# Patient Record
Sex: Male | Born: 1960 | Race: White | Hispanic: No | Marital: Married | Smoking: Never smoker
Health system: Southern US, Community
[De-identification: ages and names within clinical notes are randomized; demographics above are authoritative.]

## PROBLEM LIST (undated history)

## (undated) DIAGNOSIS — Z87442 Personal history of urinary calculi: Secondary | ICD-10-CM

## (undated) DIAGNOSIS — E039 Hypothyroidism, unspecified: Secondary | ICD-10-CM

## (undated) DIAGNOSIS — C801 Malignant (primary) neoplasm, unspecified: Secondary | ICD-10-CM

## (undated) HISTORY — PX: CYSTOSCOPY: SUR368

## (undated) HISTORY — PX: EXTRACORPOREAL SHOCK WAVE LITHOTRIPSY: SHX1557

## (undated) HISTORY — PX: COLON SURGERY: SHX602

## (undated) HISTORY — PX: HERNIA REPAIR: SHX51

---

## 1999-01-13 ENCOUNTER — Inpatient Hospital Stay (HOSPITAL_COMMUNITY): Admission: EM | Admit: 1999-01-13 | Discharge: 1999-01-14 | Payer: Self-pay | Admitting: Internal Medicine

## 1999-01-15 ENCOUNTER — Inpatient Hospital Stay (HOSPITAL_COMMUNITY): Admission: EM | Admit: 1999-01-15 | Discharge: 1999-01-16 | Payer: Self-pay | Admitting: Interventional Cardiology

## 1999-04-06 ENCOUNTER — Ambulatory Visit: Admission: RE | Admit: 1999-04-06 | Discharge: 1999-04-06 | Payer: Self-pay | Admitting: Urology

## 1999-11-04 ENCOUNTER — Ambulatory Visit (HOSPITAL_COMMUNITY): Admission: RE | Admit: 1999-11-04 | Discharge: 1999-11-04 | Payer: Self-pay | Admitting: Urology

## 1999-11-04 ENCOUNTER — Encounter: Payer: Self-pay | Admitting: Urology

## 2000-05-11 ENCOUNTER — Encounter: Payer: Self-pay | Admitting: Urology

## 2000-05-11 ENCOUNTER — Ambulatory Visit (HOSPITAL_COMMUNITY): Admission: RE | Admit: 2000-05-11 | Discharge: 2000-05-11 | Payer: Self-pay | Admitting: Urology

## 2000-10-03 ENCOUNTER — Ambulatory Visit (HOSPITAL_BASED_OUTPATIENT_CLINIC_OR_DEPARTMENT_OTHER): Admission: RE | Admit: 2000-10-03 | Discharge: 2000-10-03 | Payer: Self-pay | Admitting: Orthopedic Surgery

## 2001-06-21 ENCOUNTER — Ambulatory Visit (HOSPITAL_COMMUNITY): Admission: RE | Admit: 2001-06-21 | Discharge: 2001-06-21 | Payer: Self-pay | Admitting: Urology

## 2001-06-21 ENCOUNTER — Encounter: Payer: Self-pay | Admitting: Urology

## 2002-12-09 ENCOUNTER — Ambulatory Visit (HOSPITAL_COMMUNITY): Admission: RE | Admit: 2002-12-09 | Discharge: 2002-12-09 | Payer: Self-pay | Admitting: Urology

## 2002-12-09 ENCOUNTER — Encounter: Payer: Self-pay | Admitting: Urology

## 2002-12-12 ENCOUNTER — Ambulatory Visit (HOSPITAL_BASED_OUTPATIENT_CLINIC_OR_DEPARTMENT_OTHER): Admission: RE | Admit: 2002-12-12 | Discharge: 2002-12-12 | Payer: Self-pay | Admitting: Urology

## 2002-12-12 ENCOUNTER — Encounter: Payer: Self-pay | Admitting: Urology

## 2004-03-01 ENCOUNTER — Ambulatory Visit (HOSPITAL_BASED_OUTPATIENT_CLINIC_OR_DEPARTMENT_OTHER): Admission: RE | Admit: 2004-03-01 | Discharge: 2004-03-01 | Payer: Self-pay | Admitting: Urology

## 2004-07-12 ENCOUNTER — Encounter: Payer: Self-pay | Admitting: Internal Medicine

## 2005-05-25 ENCOUNTER — Ambulatory Visit: Payer: Self-pay | Admitting: Internal Medicine

## 2005-07-07 ENCOUNTER — Ambulatory Visit: Payer: Self-pay | Admitting: Internal Medicine

## 2005-12-23 ENCOUNTER — Ambulatory Visit: Payer: Self-pay | Admitting: Internal Medicine

## 2006-06-22 ENCOUNTER — Ambulatory Visit (HOSPITAL_COMMUNITY): Admission: RE | Admit: 2006-06-22 | Discharge: 2006-06-22 | Payer: Self-pay | Admitting: Urology

## 2006-08-03 ENCOUNTER — Ambulatory Visit: Payer: Self-pay | Admitting: Internal Medicine

## 2006-08-04 ENCOUNTER — Ambulatory Visit: Payer: Self-pay | Admitting: Cardiology

## 2006-08-04 ENCOUNTER — Inpatient Hospital Stay (HOSPITAL_COMMUNITY): Admission: RE | Admit: 2006-08-04 | Discharge: 2006-08-05 | Payer: Self-pay | Admitting: Internal Medicine

## 2006-08-04 ENCOUNTER — Ambulatory Visit: Payer: Self-pay | Admitting: Internal Medicine

## 2006-08-10 ENCOUNTER — Ambulatory Visit (HOSPITAL_COMMUNITY): Admission: RE | Admit: 2006-08-10 | Discharge: 2006-08-10 | Payer: Self-pay | Admitting: Internal Medicine

## 2006-08-15 ENCOUNTER — Ambulatory Visit (HOSPITAL_COMMUNITY): Admission: RE | Admit: 2006-08-15 | Discharge: 2006-08-15 | Payer: Self-pay | Admitting: Internal Medicine

## 2006-08-18 ENCOUNTER — Ambulatory Visit: Payer: Self-pay | Admitting: Internal Medicine

## 2006-10-07 ENCOUNTER — Inpatient Hospital Stay (HOSPITAL_COMMUNITY): Admission: EM | Admit: 2006-10-07 | Discharge: 2006-10-12 | Payer: Self-pay | Admitting: Emergency Medicine

## 2006-10-19 ENCOUNTER — Ambulatory Visit (HOSPITAL_COMMUNITY): Admission: RE | Admit: 2006-10-19 | Discharge: 2006-10-19 | Payer: Self-pay | Admitting: General Surgery

## 2006-11-20 DIAGNOSIS — Z87442 Personal history of urinary calculi: Secondary | ICD-10-CM

## 2006-11-20 DIAGNOSIS — E785 Hyperlipidemia, unspecified: Secondary | ICD-10-CM

## 2006-11-20 DIAGNOSIS — E8881 Metabolic syndrome: Secondary | ICD-10-CM

## 2006-11-20 DIAGNOSIS — H20029 Recurrent acute iridocyclitis, unspecified eye: Secondary | ICD-10-CM

## 2006-11-22 ENCOUNTER — Inpatient Hospital Stay (HOSPITAL_COMMUNITY): Admission: AD | Admit: 2006-11-22 | Discharge: 2006-11-27 | Payer: Self-pay | Admitting: General Surgery

## 2006-11-23 ENCOUNTER — Ambulatory Visit: Payer: Self-pay | Admitting: Infectious Diseases

## 2006-11-28 ENCOUNTER — Ambulatory Visit (HOSPITAL_COMMUNITY): Admission: RE | Admit: 2006-11-28 | Discharge: 2006-11-28 | Payer: Self-pay | Admitting: General Surgery

## 2006-12-29 ENCOUNTER — Inpatient Hospital Stay (HOSPITAL_COMMUNITY): Admission: RE | Admit: 2006-12-29 | Discharge: 2007-01-03 | Payer: Self-pay | Admitting: General Surgery

## 2006-12-29 ENCOUNTER — Encounter (INDEPENDENT_AMBULATORY_CARE_PROVIDER_SITE_OTHER): Payer: Self-pay | Admitting: Specialist

## 2007-02-01 ENCOUNTER — Ambulatory Visit: Payer: Self-pay | Admitting: Internal Medicine

## 2007-02-02 LAB — CONVERTED CEMR LAB
Cholesterol: 208 mg/dL (ref 0–200)
HDL: 31.6 mg/dL — ABNORMAL LOW (ref >39.0)
Hgb A1c MFr Bld: 5.7 % (ref 4.6–6.0)
Total CHOL/HDL Ratio: 6.6
Triglycerides: 253 mg/dL (ref 0–149)
VLDL: 51 mg/dL — ABNORMAL HIGH (ref 0–40)

## 2007-04-12 ENCOUNTER — Ambulatory Visit: Payer: Self-pay | Admitting: Internal Medicine

## 2007-06-26 ENCOUNTER — Encounter: Payer: Self-pay | Admitting: Internal Medicine

## 2007-06-29 ENCOUNTER — Encounter: Payer: Self-pay | Admitting: Internal Medicine

## 2007-07-17 ENCOUNTER — Encounter: Payer: Self-pay | Admitting: Internal Medicine

## 2007-08-07 ENCOUNTER — Encounter: Payer: Self-pay | Admitting: Internal Medicine

## 2007-08-17 ENCOUNTER — Encounter: Payer: Self-pay | Admitting: Internal Medicine

## 2007-08-21 ENCOUNTER — Telehealth (INDEPENDENT_AMBULATORY_CARE_PROVIDER_SITE_OTHER): Payer: Self-pay | Admitting: *Deleted

## 2007-09-18 ENCOUNTER — Telehealth (INDEPENDENT_AMBULATORY_CARE_PROVIDER_SITE_OTHER): Payer: Self-pay | Admitting: *Deleted

## 2007-09-24 ENCOUNTER — Ambulatory Visit: Payer: Self-pay | Admitting: Internal Medicine

## 2007-09-25 ENCOUNTER — Telehealth (INDEPENDENT_AMBULATORY_CARE_PROVIDER_SITE_OTHER): Payer: Self-pay | Admitting: *Deleted

## 2007-10-01 LAB — CONVERTED CEMR LAB
ALT: 21 units/L (ref 0–53)
AST: 21 units/L (ref 0–37)
Direct LDL: 160.7 mg/dL
HDL: 29.6 mg/dL — ABNORMAL LOW (ref >39.0)
Triglycerides: 225 mg/dL (ref 0–149)
VLDL: 45 mg/dL — ABNORMAL HIGH (ref 0–40)

## 2008-01-31 IMAGING — RF DG SMALL BOWEL
8 series · 8 of 8 positions shown · non-contrast
Comparison: none

CLINICAL DATA: SMALL BOWEL SERIES:
The findings are compatible with acute inflammatory changes involving the distal small bowel and mesentery.

[Series 1: run · 1 of 1 slices shown (1 of 8)]
[im 1/1]
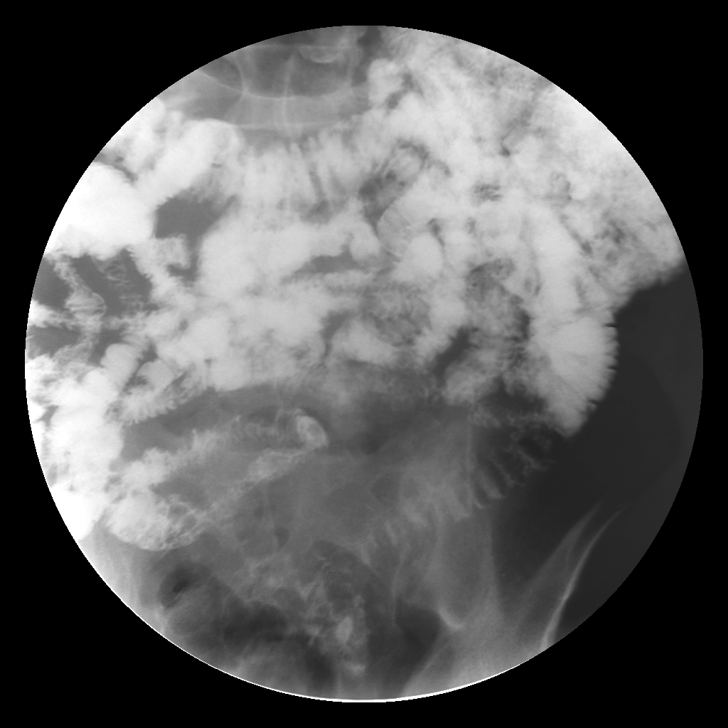

[Series 2: run · 1 of 1 slices shown (2 of 8)]
[im 1/1]
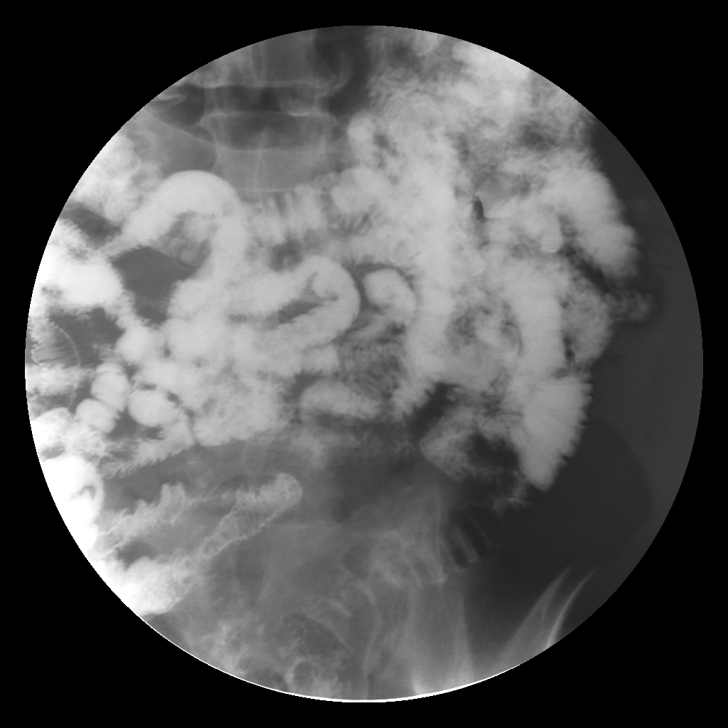

[Series 3: run · 1 of 1 slices shown (3 of 8)]
[im 1/1]
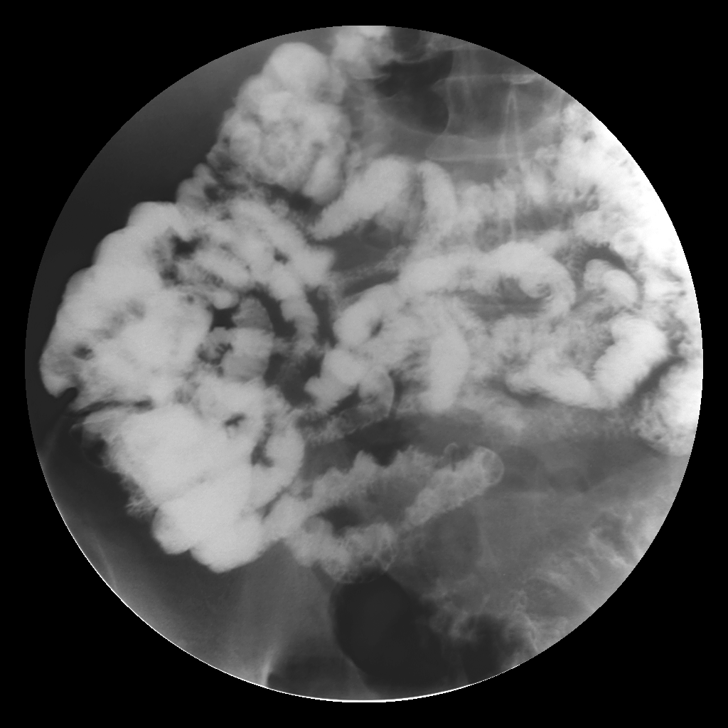

[Series 4: run · 1 of 1 slices shown (4 of 8)]
[im 1/1]
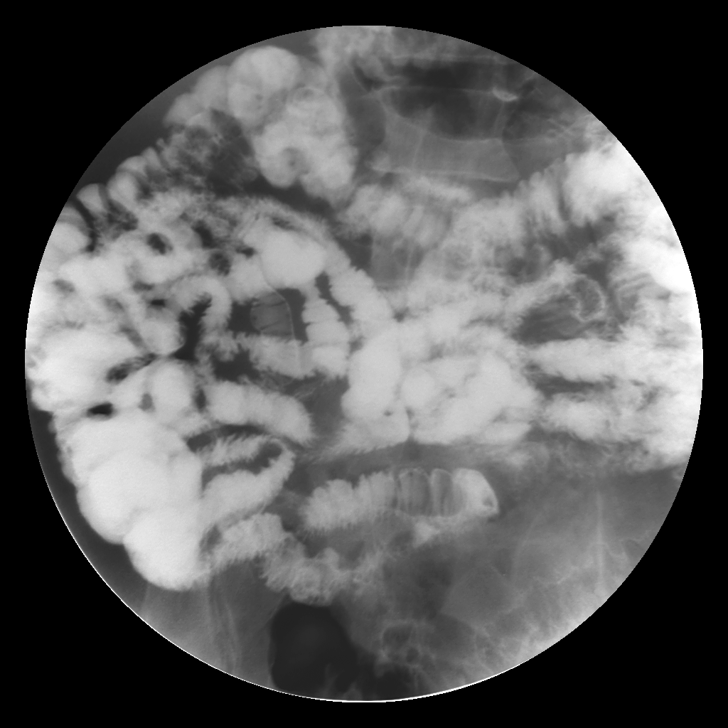

[Series 5: run · 1 of 1 slices shown (5 of 8)]
[im 1/1]
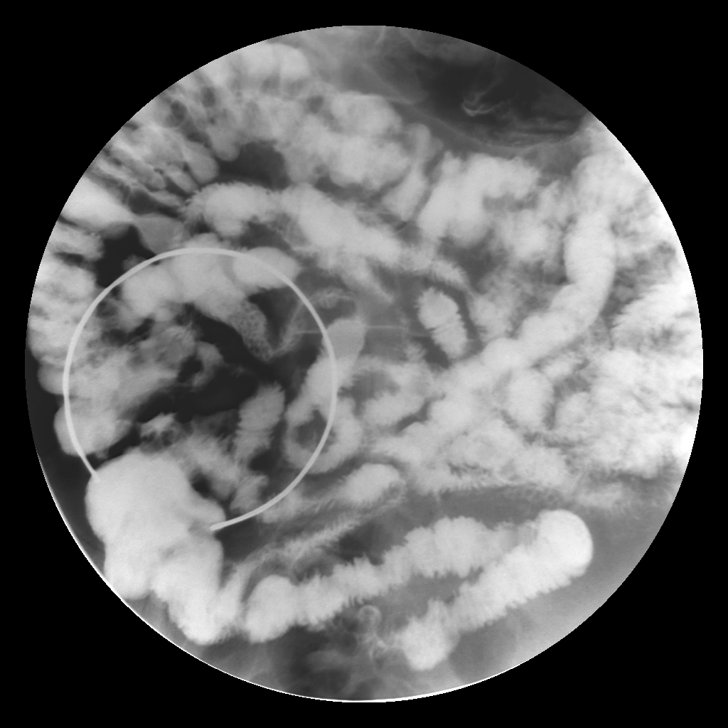

[Series 6: run · 1 of 1 slices shown (6 of 8)]
[im 1/1]
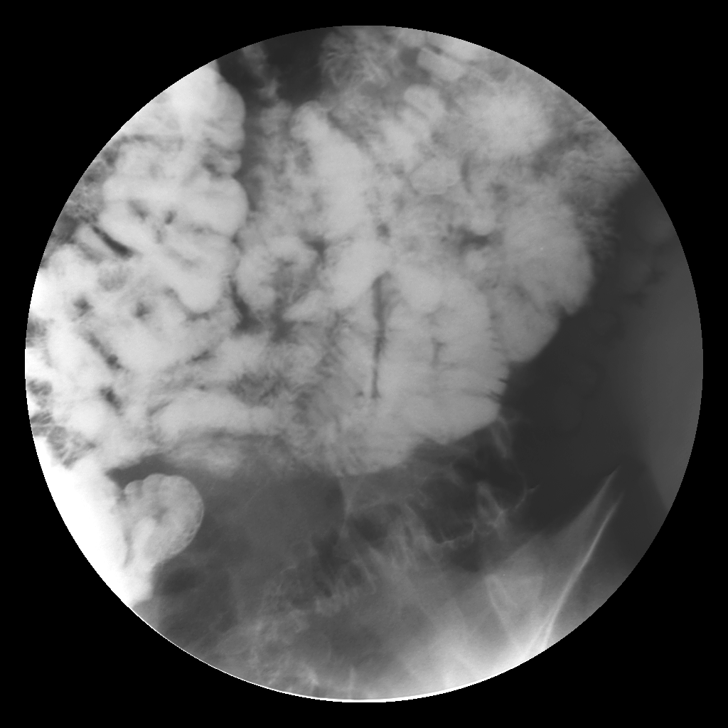

[Series 7: run · 1 of 1 slices shown (7 of 8)]
[im 1/1]
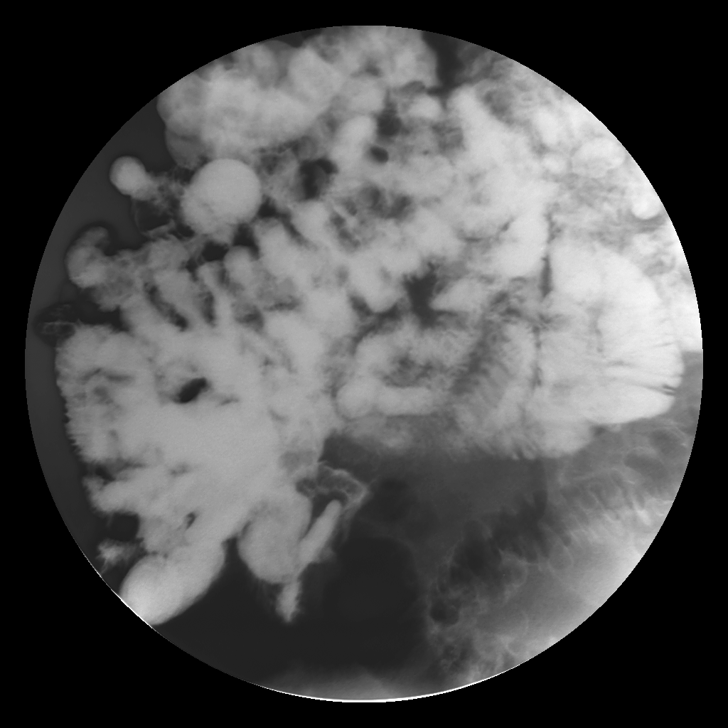

[Series 8: run · 1 of 1 slices shown (8 of 8)]
[im 1/1]
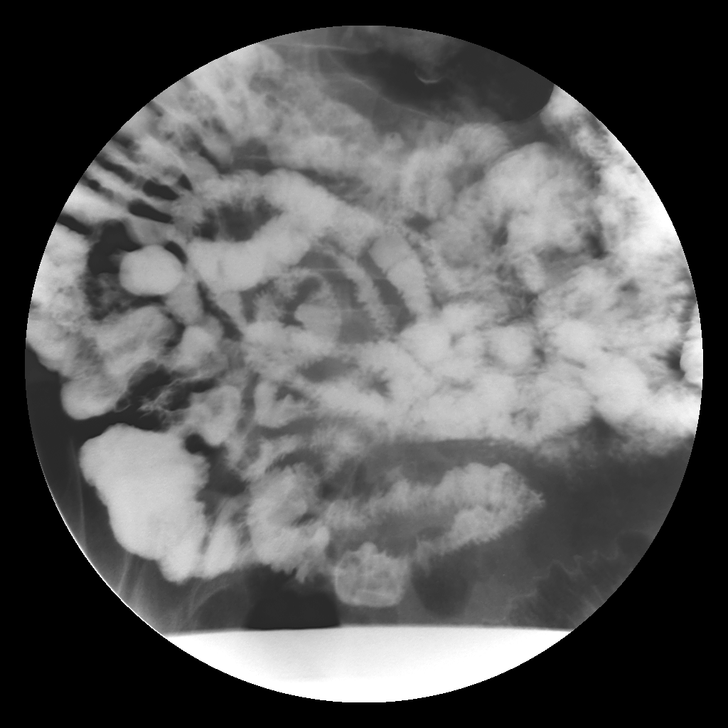

[8 of 8 positions shown; findings below may reference images not displayed]

FINDINGS: The patient ingested a 50/50 mixture Enterovue  and thin liquid barium and serial overhead views plus fluoroscopic guided spot views, with and without manual compression were obtained.  A couple of loops of distal ileum in the pelvis which reveal minimally prominent mucosal fold pattern and the loops are somewhat separated from each other. This is compatible with the inflammatory changes noted on CT.  There is no evidence of small bowel stricture, fistula or sinus tract. There are no findings to strongly suggest the presence of Crohn disease.  The terminal ileum is normal.  No delay in barium transit through the small bowel.
IMPRESSION: Findings compatible with mild and subtle inflammatory changes involving a couple of distal ileal loops.  These may represent reactive changes due to an adjacent inflammatory process as opposed to primary small bowel pathology.

## 2008-02-03 IMAGING — CR DG ABDOMEN 1V
2 series · 2 of 2 positions shown · non-contrast
Comparison: none

CLINICAL DATA: Diverticulitis.  
 ABDOMEN ? 1 VIEW:

[t abdomen supine (1 of 2)]
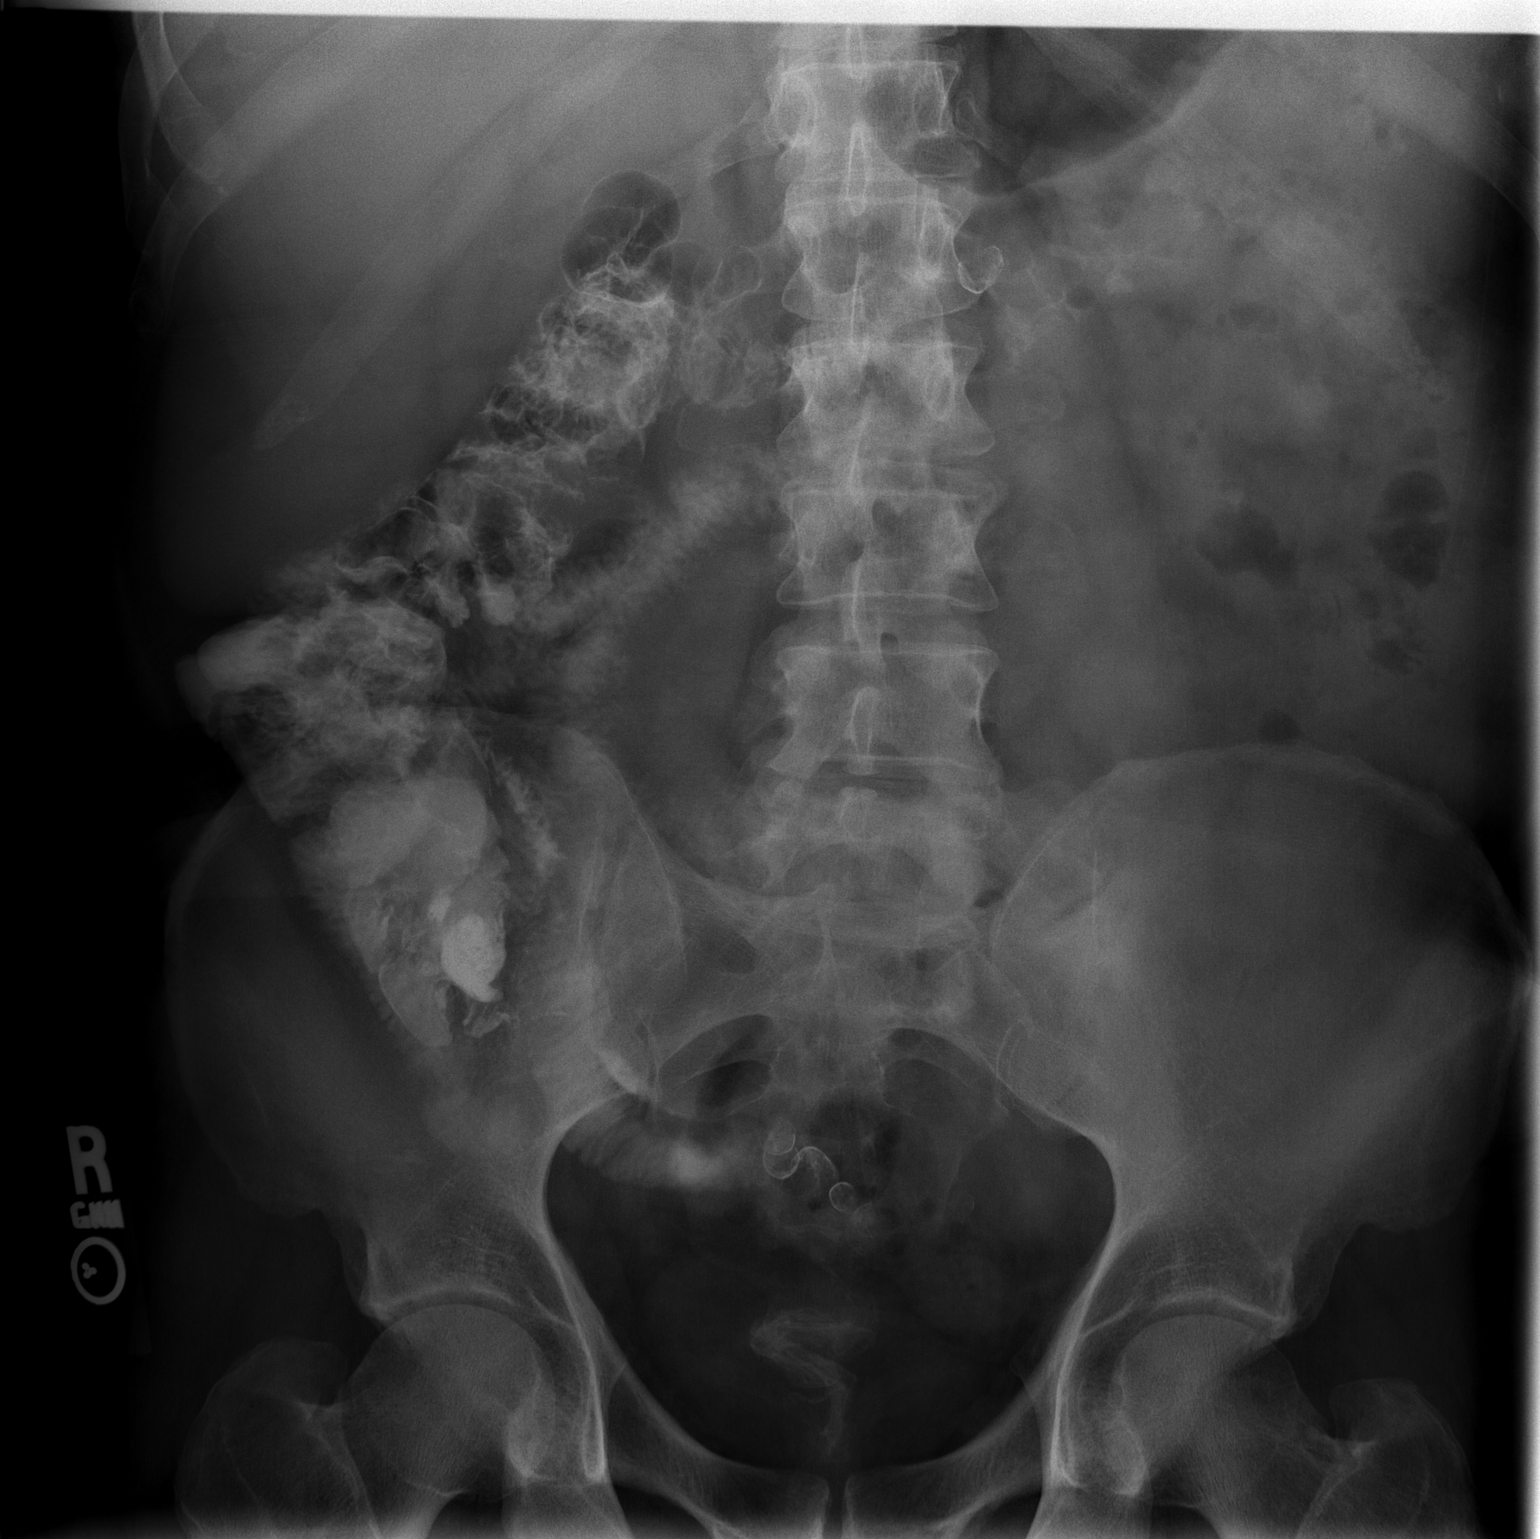

[t abdomen supine (2 of 2)]
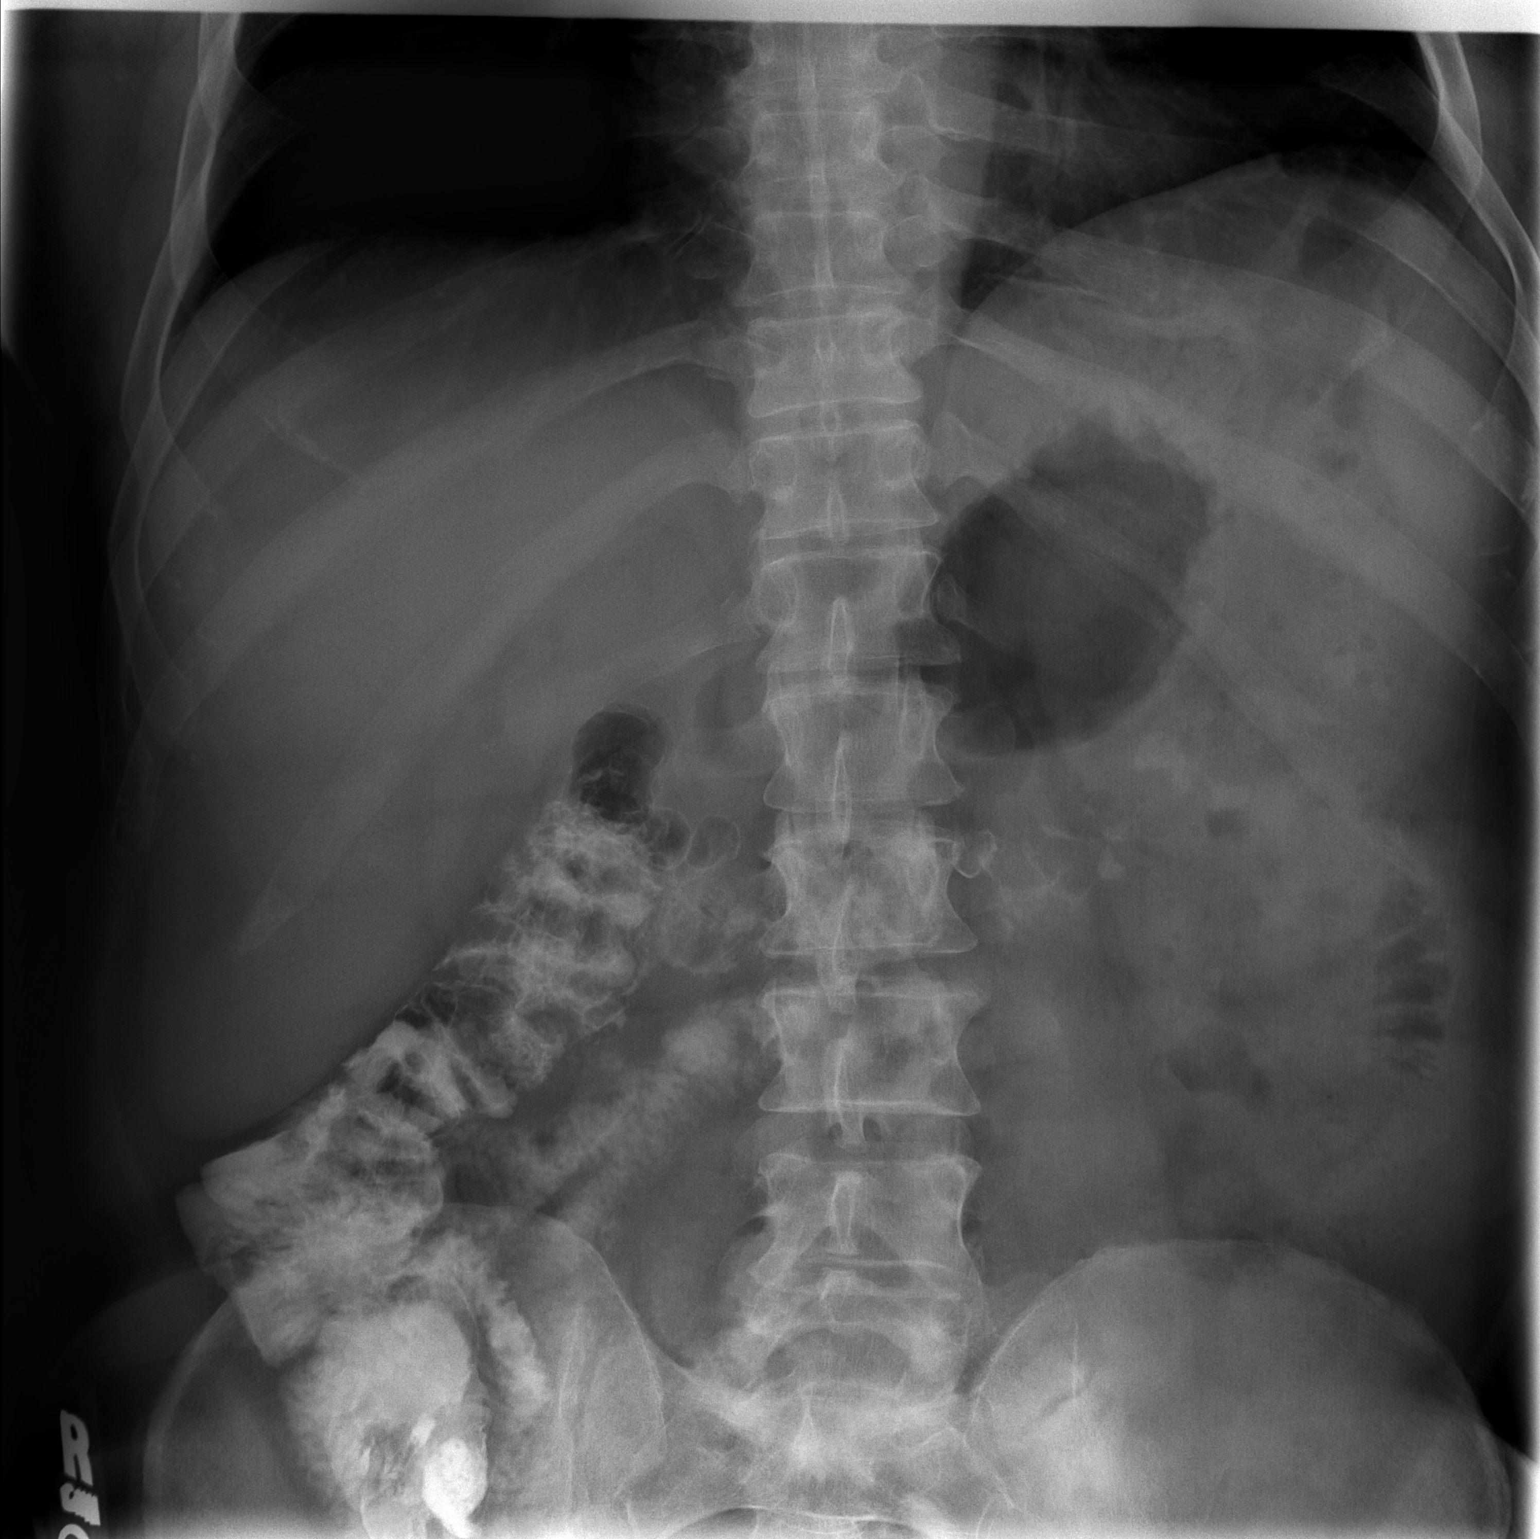

[2 of 2 positions shown; findings below may reference images not displayed]

FINDINGS: There is contrast seen within the distal small bowel loops and within the right colon.  There is a small amount contrast seen within sigmoid colonic diverticula.  The left colon and sigmoid colon are not filled with contrast.  The bowel gas pattern is within normal limits.
IMPRESSION: Normal bowel gas pattern.  Mild retained contrast noted within the right colon and distal small bowel loops.

## 2008-03-05 ENCOUNTER — Telehealth (INDEPENDENT_AMBULATORY_CARE_PROVIDER_SITE_OTHER): Payer: Self-pay | Admitting: *Deleted

## 2008-04-10 ENCOUNTER — Telehealth (INDEPENDENT_AMBULATORY_CARE_PROVIDER_SITE_OTHER): Payer: Self-pay | Admitting: *Deleted

## 2008-04-24 ENCOUNTER — Ambulatory Visit: Payer: Self-pay | Admitting: Internal Medicine

## 2008-04-24 DIAGNOSIS — Z8719 Personal history of other diseases of the digestive system: Secondary | ICD-10-CM

## 2008-04-24 DIAGNOSIS — K5730 Diverticulosis of large intestine without perforation or abscess without bleeding: Secondary | ICD-10-CM | POA: Insufficient documentation

## 2008-04-28 ENCOUNTER — Ambulatory Visit: Payer: Self-pay | Admitting: Internal Medicine

## 2008-05-06 ENCOUNTER — Telehealth (INDEPENDENT_AMBULATORY_CARE_PROVIDER_SITE_OTHER): Payer: Self-pay | Admitting: *Deleted

## 2008-05-07 ENCOUNTER — Telehealth: Payer: Self-pay | Admitting: Internal Medicine

## 2008-05-09 ENCOUNTER — Telehealth (INDEPENDENT_AMBULATORY_CARE_PROVIDER_SITE_OTHER): Payer: Self-pay | Admitting: *Deleted

## 2008-09-30 ENCOUNTER — Ambulatory Visit (HOSPITAL_BASED_OUTPATIENT_CLINIC_OR_DEPARTMENT_OTHER): Admission: RE | Admit: 2008-09-30 | Discharge: 2008-09-30 | Payer: Self-pay | Admitting: Urology

## 2010-11-21 LAB — CONVERTED CEMR LAB
ALT: 25 units/L (ref 0–53)
Albumin: 4 g/dL (ref 3.5–5.2)
Alkaline Phosphatase: 41 units/L (ref 39–117)
Basophils Absolute: 0 10*3/uL (ref 0.0–0.1)
Basophils Relative: 1 % (ref 0.0–1.0)
Chloride: 105 meq/L (ref 96–112)
Creatinine, Ser: 1.3 mg/dL (ref 0.4–1.5)
Eosinophils Absolute: 0.1 10*3/uL (ref 0.0–0.7)
Eosinophils Relative: 3.1 % (ref 0.0–5.0)
GFR calc non Af Amer: 63 mL/min
HDL: 26.8 mg/dL — ABNORMAL LOW (ref >39.0)
Hgb A1c MFr Bld: 6.2 % — ABNORMAL HIGH (ref 4.6–6.0)
Lymphocytes Relative: 22.2 % (ref 12.0–46.0)
MCV: 93 fL (ref 78.0–100.0)
Monocytes Relative: 12.1 % — ABNORMAL HIGH (ref 3.0–12.0)
Neutro Abs: 2.1 10*3/uL (ref 1.4–7.7)
Platelets: 199 10*3/uL (ref 150–400)
Potassium: 4.6 meq/L (ref 3.5–5.1)
Sodium: 142 meq/L (ref 135–145)
TSH: 3.38 microintl units/mL (ref 0.35–5.50)
Total CHOL/HDL Ratio: 8.7
Total Protein: 7 g/dL (ref 6.0–8.3)
VLDL: 41 mg/dL — ABNORMAL HIGH (ref 0–40)
WBC: 3.3 10*3/uL — ABNORMAL LOW (ref 4.5–10.5)

## 2011-03-08 NOTE — Op Note (Signed)
NAMECLEARNCE, LEJA                 ACCOUNT NO.:  [id number]   MEDICAL RECORD NO.:  [id number]          PATIENT TYPE:  AMB   LOCATION:  [facility]                         FACILITY:  [facility]   PHYSICIAN:  Bertram Millard. Dahlstedt, M.D.DATE OF BIRTH:  [birth year]   DATE OF PROCEDURE:  09/30/2008  DATE OF DISCHARGE:                               OPERATIVE REPORT   SURGEON:  Dr. Marcine Matar.   ASSISTANT:  Dr. Aldean Baker.   PREOPERATIVE DIAGNOSIS:  Bilateral renal stones.   POSTOPERATIVE DIAGNOSIS:  Bilateral renal stones.   PROCEDURES:  1. Pan cystourethroscopy.  2. Bilateral retrograde pyelogram.  3. Right ureteroscopy with laser lithotripsy and basket stone      extraction.  4. Left ureteroscopy with laser lithotripsy and basket stone      extraction.  5. Placement of a left-sided ureteral stent.  6. Bilateral retrograde pyelograms.   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  He has had multiple stone  procedures in the past and has developed recurrent stones with bilateral  flank pain.  He is here today for elective ureteroscopy and laser  lithotripsy with stone manipulation.  Risks and benefits were discussed  with the patient as well as alternatives preoperatively and he elected  to proceed.   PROCEDURE IN DETAIL:  The patient was brought back to the operative  room.  After successful induction of general endotracheal anesthetic, he  was placed in dorsal position and all pressure points padded  appropriately.  A preoperative time-out was performed and he received  preoperative antibiotics.   Using a 22-French sheath with a 12 and 70 degrees lens, pan  cystourethroscopy was performed.  The patient's urethra and prostate  appeared normal.  There was no evidence of stricture, tumor, foreign  body or other anomaly.  On entering the patient's bladder, both ureteral  orifices were seen at the lateral  aspects of the trigone effluxing clear  urine.  There is no anomaly seen in the bladder, specifically, no  evidence of tumor, foreign body or stone.   At this point the right ureteral orifice was cannulated with end-hole  catheter and a retrograde pyelogram shot.  Filling defects were seen in  the interpolar and lower pole areas of the right kidney.  No ureteral  defects were seen.  At this point a sensor wire was placed in the upper  right collecting system.   Over the Glidewire, the access sheath was placed under direct  fluoroscopic visualization.  Once into and near the UPJ,  we used a  digital ureteroscope to examine the patient's upper collecting system.  Stones were seen in the interpolar and lower polar areas.  The stones  were lasered to smaller pieces that would fit through the access sheath.  The laser setting was 0.8 joules and 5 Hz.  The stones were then grasped  with a nitinol basket and brought out through the access sheath.  Once  all the fragments had been removed, the entire collecting system was  reinspected and  no stones were seen.  At this point, the access sheath  was removed under direct visualization.   At this point, the left ureteral orifice was cannulated with 5-French  end-hole catheter.  Retrograde pyelogram showed filling defects in the  lower pole of the left kidney.  No ureteral anomalies were seen.  At  this point a sensor wire was placed up fluoroscopically and an access  sheath was placed over it to the level of UPJ.  Then using the flexible  scope, We navigated the entire collecting system and identified an  approximately 8 mm lower pole stone.  The stone was lasered at settings  of 0.8 joules and 5 Hz into smaller fragments which were grasped with  the basket and removed in their entirety through the access sheath.  No  large stone fragments were seen at the end of the procedure.  However,  due to some minimal bleeding with clots a 6 x 26-French  left ureteral  stent was placed.  Good proximal and distal curls were seen and the  tether was secured to the urethral meatus.  At this point using a 22-  French sheath, the bladder was emptied.  There were no stones seen in  the bladder and the procedure was ended.  Please note Dr. Retta Diones was  responsible surgeon and present throughout the entirety of the case.  Estimated blood loss was minimal.  Urine output was not recorded.   DRAINS:  A 6 x 26 French left ureteral stent.   SPECIMENS:  Stone for analysis.   DISPOSITION:  The patient will go to the PACU for further care.      Delman Kitten, MD      Bertram Millard. Dahlstedt, M.D.  Electronically Signed    DW/MEDQ  D:  09/30/2008  T:  09/30/2008  Job:  [id number]

## 2011-03-11 NOTE — Letter (Signed)
August 23, 2006    Petra Kuba, M.D.  [address], [address]  [place], [place] [address]   RE:  TAGE, FEGGINS  MRN:  [id number]  /  DOB:  1961-08-23   Dear Loraine Leriche:   Bonita Quin are scheduled to see Jesusita Oka on November 9 at 9:15.  The specific concerns I  have is the duration of antibiotic therapy for his diverticular abscess &  need fore endoscopic monitor @ some point.  Please see the detailed history  and physical and discharge summary from his October 12, to October 13  admission.  His culture grew Klebsiella pneumoniae sensitive to Cipro,  resistant to ampicillin.   When seen on October 26 he was afebrile and had no abdominal symptoms.  He  had been out of his antibiotics from October 22 to October 26 following a  catheter removal. He had taken 10 days of amoxicillin (see resistance,  above) and 10 days of Flagyl as well as 7 days of Cipro.  Until he sees you,  I have recommended that he continue the Flagyl and the Cipro.   An incidental finding during the hospitalization was a 6.5 x 4.4 mm lucency  in the right ileum.  Plain films of the ileum will be pursued on January  2008; this was discussed with Jesusita Oka and April at that October 26 visit.   I also told him that I anticipate you may wish to perform a colonoscopy at  some point, but obviously you would want  healing to occur prior to pursuing  any invasive procedures.   He has had a metabolic syndrome picture but has been most diligent in his  diet and exercise and is markedly improved.  I will follow the lipid and A1C  parameters once he has been symptom free for several months.   Thank you for your evaluation and recommendations.    Sincerely,      Titus Dubin. Alwyn Ren, MD,FACP,FCCP  Electronically Signed    WFH/MedQ  DD: 08/23/2006  DT: 08/24/2006  Job #: [id number]

## 2011-03-11 NOTE — H&P (Signed)
NAMEEFE, FAZZINO                 ACCOUNT NO.:  [id number]   MEDICAL RECORD NO.:  [id number]          PATIENT TYPE:  INP   LOCATION:  5509                         FACILITY:  [facility]   PHYSICIAN:  Ardeth Sportsman, MD     DATE OF BIRTH:  1961-09-11   DATE OF ADMISSION:  10/07/2006  DATE OF DISCHARGE:                              HISTORY & PHYSICAL   PRIMARY CARE PHYSICIAN:  I believe is Marga Melnick.   Requesting physician is Bernette Redbird with [facility].   Surgeon is Karie Soda.   REASON FOR CONSULT:  Sudden abdominal pain.  History of diverticulitis  with abscess.   HISTORY OF PRESENT ILLNESS:  Mr. Gilham is a 50 year old male who back in  October started developing abdominal pain and had evidence of  diverticulitis.  He worsened and was found on CT scan to have a 7 x 7 cm  abscess.  This was drained, which was left in for 10 days.  Dr. Ezzard Standing  with surgery helped follow the patient.  He was watched overnight and  transitioned to oral ampicillin and ciprofloxacin.  His cultures grew  out Klebsiella, which was resistant to ampicillin, so eventually we  switched over to Cipro and Flagyl.  He seemed to improve and was on oral  antibiotics for about a total of 10 days.   His pain gradually resolved and he was back to his normal daily bowel  movements with no nausea or vomiting.  However, he noted after traveling  to [facility] for a funeral he felt some lower abdominal pains earlier  this morning and then another more sharper episode.  He took some oral  narcotic pain medications and was able to be safe enough to go back to  [place] region.  Gastroenterology was called and he was recommended  to come to the emergency room.   He noted a little bit of nausea earlier today, but right now he denies  any significant nausea.  He feels like his abdominal pain is relatively  mild.  He is having flatus.  No dysphagia or heartburn.  No hematochezia  or melena.  No sick  contacts or travel history.   PAST MEDICAL HISTORY:  1. Hypercholesterolemia.  2. Diverticulosis.  3. He has a history of recurrent kidney stones.   PAST SURGICAL HISTORY:  1. He had a right inguinal hernia repair.  2. He has had lithotripsy.  3. He had tonsillectomy/adenoidectomy as a child.  4. He had interventional radiology percutaneous drainage of a      diverticular abscess performed on August 04, 2006.  5. He had attempted fistulogram on August 10, 2006, with no evidence      of any fistula, and on October 23, noted smaller residual cavity      with no fistula.  Catheter was removed at that time.   SOCIAL HISTORY:  No tobacco, alcohol, or other drug use.  He is married.  His wife who is a Garment/textile technologist.  He is in a stable relationship.   Family history is negative for any major colonic or disorders.  No  inflammatory bowel disease.  No major GI or hepatobiliary problems.   Medications include:  1. Vytorin 10/40.  2. Folic acid.  3. Flax seed.  4. Aspirin 81 mg.  5. Oral garlic.   ALLERGIES:  None.   REVIEW OF SYSTEMS:  As noted per HPI.  Otherwise, constitutional,  ophthalmological, ENT, cardiorespiratory, testicular, vascular,  musculoskeletal, dermatologic, neurologic, psychiatric, endocrine,  allergic are negative.  GENITOURINARY:  He has had a little bit of  dysuria but no definite pyuria or hematuria.   VITAL SIGNS:  His temperature is T-max of 100.8.  Pulse in the 90s.  Respirations are at 18.  Blood pressure has been in the 110s to 120s  systolic.  He is 92% on room air.  GENERAL:  He is a well-developed, well-nourished, slightly overweight  male in no acute distress.  PSYCHIATRIC:  He is pleasant, interactive, of at least average  intelligence.  No evidence of dementia, psychosis, paranoia.  Eyes:  Pupils equal, round, reactive to light.  Extraocular movements  are intact.  The sclerae are not icteric or injected.  HEENT is normocephalic without  facial asymmetry.  Mucous membranes are  moist.  Nasopharynx and oropharynx clear.  NECK:  Supple without any masses.  Trachea is midline.  HEART:  Regular rate and rhythm.  No murmurs, gallops, or rubs.  CHEST:  Clear to auscultation bilaterally.  No wheezes, rales, or  rhonchi.  No pain with deep inspiration.  No rib or sternal compression.  Abdomen is obese but soft.  Not particularly distended.  He has some  mild diffuse abdominal tenderness but no severe focal tenderness.  His  area discomfort is kind of in the lower midline to left lower quadrant  region.  He has no guarding.  He only has very mild pain to a strong  cough.  EXTREMITIES:  No clubbing or cyanosis.  MUSCULOSKELETAL:  Full range of motion shoulders, elbows, wrists, hips,  knees, ankles.  LYMPH:  No head, neck, axillary, or groin lymphadenopathy.  SKIN:  No obvious petechiae or purpura.  No evidence of any ecchymosis.  LABORATORY VALUES:  His white count is 11.8 with a left shift.  His  electrolytes and lipase and urinalysis are all negative.  Hemoglobin is  16.  CT scan of the abdomen is reviewed with radiology and shows evidence of  a 3 x 3 cm diverticular abscess with an air bubble in it.  It looks like  there is some extraluminal air between some small bowel loops and a few  dots of free air along his anterior abdominal wall anterior to his liver  and spleen.  Actually inflammation and phlegmon seems to be less  compared to 2 months ago, but he definitely has free air.  It is not  massive.  There is no evidence of any bowel obstruction, or mass, or  free fluid.  No new fluid collection.   ASSESSMENT/PLAN:  50 year old male with diverticulitis with abscess  status post drainage and oral antibiotics who has recurrent pain and new  abscess.  1. Admit.  2. IV antibiotics (Zosyn, Cipro, fluconazole). 3. Interventional radiology request to aspirate abscess for new      culture to make sure he is not developing  resistant organisms and      also do a fistulogram to see if he has any abnormal connections to      bowel and/or bladder, especially with bladder symptoms.  4. N.p.o.  He may even need parenteral  nutrition for a while.  5. Serial exams.  If he has worsening pain or develops shock, then      will require emergent exploration with partial colectomy and      probable ostomy.  6. He will still ultimately need a colonoscopy.  Hopefully,      preoperative or at least before ostomy takedown to make sure he      does not have any abnormal masses or other areas of concern.   I explained this plan, recommendation to the patient and his wife.  He  has failed 1 antibiotic course and I therefore feel rather strongly that  he needs to have about 48 to 72 hours of IV antibiotics and make sure  his cultures may get a sense of what are appropriate oral antibiotics  for him.      Ardeth Sportsman, MD  Electronically Signed     SCG/MEDQ  D:  10/07/2006  T:  10/09/2006  Job:  [id number]

## 2011-03-11 NOTE — Op Note (Signed)
NAMECURRY, SEEFELDT                 ACCOUNT NO.:  [id number]   MEDICAL RECORD NO.:  [id number]          PATIENT TYPE:  INP   LOCATION:  2550                         FACILITY:  [facility]   PHYSICIAN:  Leonie Man, M.D.   DATE OF BIRTH:  1961-04-15   DATE OF PROCEDURE:  12/29/2006  DATE OF DISCHARGE:                               OPERATIVE REPORT   PREOPERATIVE DIAGNOSIS:  Sigmoid diverticulitis.   POSTOPERATIVE DIAGNOSIS:  Sigmoid diverticulitis.   PROCEDURE:  Sigmoid colectomy, low colorectal anastomosis and  appendectomy.   SURGEON:  Dr. Leonie Man.   ASSISTANT:  Dr. Cyndia Bent   SECOND ASSISTANT:  Dr. De Blanch.   ESTIMATED BLOOD LOSS:  200 mL.   COMPLICATIONS:  None noted.  The patient returned to the PACU in  excellent condition.   NOTE:  The patient is a 50 year old male with complicated diverticulitis  having undergone a percutaneous drainage of two separate abscesses for  perforated diverticular disease.  Most recently had yet a third abscess  which was rather small and not worth draining.  The patient was placed  on long term antibiotics and comes to the operating room now after  appropriate bowel prep had been completed for sigmoid colectomy.   The patient understands the risks and potential benefits of surgery and  gives his consent to same.   PROCEDURE:  Following induction of satisfactory general anesthesia with  the patient positioned in supine lithotomy position, the abdomen and  perineum were prepped and draped to be included in a sterile operative  field.  Midline incision extending from the pubic tubercle up to the  umbilicus was deepened through the skin and subcutaneous tissue, down  through the linea alba.  The abdomen was entered.  Upon entering the  abdomen, there was a large phlegmon over the dome of the bladder which  includes sigmoid colon, the cecum and the appendix as well as a portion  of distal ileum.  In dissecting into this, an  abscess cavity was  encountered.  This was cultured both aerobically and anaerobically and  irrigated.  The adhesions were broken up, and the sigmoid colon, small  bowel, cecum and appendix were dissected free from the dome of the  bladder.  The appendix was dissected and the mesoappendix taken with a  LigaSure, and the base of the appendix crossclamped and transected with  a GIA 55 stapling device.  Dissection was then carried down along the  retroperitoneal reflection to the region of the descending colon and  sigmoid colon.  This was carried down over the pelvic brim down into the  true pelvis so as to mobilize the sigmoid colon.  There were two  separate segments of diverticular disease separated by a short segment  of normal bowel.  We elected to take the entire segment of bowel  extending from the most proximal to the most distal portion of the  sigmoid diverticular disease.  Dissection was then carried down into the  pelvis, and the rectosigmoid was then transected just below the sacral  promontory with a TA60 stapling device.  The  mesentery was then divided  using a LigaSure staple, carrying the dissection up to the area of  proposed anastomosis.  This was again transected, and a size 29 EEA  stapling device was used, using the anvil to place in the proximal  descending colon and closing this end off with a GIA 55 stapler.  The  end of the anvil was brought out through the bowel wall.  Dr. Cicero Duck then went below and dilated the rectum and inserted the EEA  stapling device, carefully transversing of the rectum up to the staple  line.  The spike was then put through, and then end-to-end anastomosis  was carried out with the EEA device.  Proctosigmoidoscopy was then  carried out, having pumped air into the rectum and distal colon.  There  was no evidence of an air leak due to the absence of bubbles in the  saline.  The staple line showed complete staple rings.  The  anastomosis  was again inspected.  The anastomosis was noted to be wide open and  quite adequate.  The pelvis was then thoroughly irrigated with multiple  aliquots of normal saline and aspirated.  The small bowel was run from  the ileocecal valve up to the ligament of Treitz.  All addition  adhesions were divided.  The entire abdomen was irrigated with normal  saline.  Sponge, instrument and sharp counts were doubly verified, and  the abdominal wound was closed in layers as follows:  The midline was  closed with a running double-stranded #1 PDS.  Subcutaneous tissues were  irrigated thoroughly and the skin closed with staples.  Sterile  dressings were applied, anesthetic reversed and the patient removed from  the operating room to the recovery room in stable condition.  He  tolerated the procedure well.      Leonie Man, M.D.  Electronically Signed     PB/MEDQ  D:  12/29/2006  T:  12/29/2006  Job:  [id number]

## 2011-03-11 NOTE — Assessment & Plan Note (Signed)
[facility]                                   ON-CALL NOTE   NAME:Piazza, FLETCHER OSTERMILLER                        MRN:          [id number]  DATE:08/03/2006                            DOB:          Jul 13, 1961    TIME:  5:08 p.m.   SUBJECTIVE:  [facility] calling regarding a prescription written by  Dr. Alwyn Ren.  Patient states that he is being treated for possible  diverticulitis.  He was given a prescription for amoxicillin and  metronidazole.  The metronidazole dose has not been specified as far as  milligrams.  The pharmacist was instructed that the metronidazole should be  500 mg p.o. t.i.d. x10 days.       Kerby Nora, MD      AB/MedQ  DD:  08/03/2006  DT:  08/05/2006  Job #:  [id number]   cc:   Titus Dubin. Alwyn Ren, MD,FACP,FCCP

## 2011-03-11 NOTE — Op Note (Signed)
NAME:  Vincent Richard, Vincent Richard                           ACCOUNT NO.:  [id number]   MEDICAL RECORD NO.:  [id number]                   PATIENT TYPE:  AMB   LOCATION:  [facility]                                 FACILITY:  [facility]   PHYSICIAN:  Bertram Millard. Dahlstedt, M.D.          DATE OF BIRTH:  May 08, 1961   DATE OF PROCEDURE:  03/01/2004  DATE OF DISCHARGE:                                 OPERATIVE REPORT   PREOPERATIVE DIAGNOSES:  Left ureteropelvic junction and left lower pole  stone, right renal calculi.   POSTOPERATIVE DIAGNOSES:  Bilateral ureteropelvic junction stone, bilateral  renal calculi.   PROCEDURE:  Cystoscopy, bilateral retrograde ureteral pyelograms, bilateral  ureterorenoscopy, bilateral holmium laser of UPJ and renal calculi,  bilateral renal stone extractions, bilateral double J stents, interpretive  fluoroscopy.   SURGEON:  Bertram Millard. Dahlstedt, M.D.   ANESTHESIA:  General.   COMPLICATIONS:  None.   BRIEF HISTORY:  This nice 50 year old gentleman has had multiple renal and  ureteral calculi. He has had at least three lithotripsies to date. He  recently saw me after he returned from vacation and was having gross  hematuria.  CT urogram revealed a left UPJ stone and 3-4 right renal and one  other left renal calculus.   I discussed the case with the patient.  This is a fairly large left UPJ  stone and I do not think he will pass it on his own.  He elected to have  intervention.  I discussed lithotripsy of the solitary left UPJ stone versus  bilateral ureteroscopies to try to get the patient as stone free as  possible.  He would prefer to do this. The risk of the surgery were  discussed with the patient and his wife. They understand these and desire to  proceed.   DESCRIPTION OF PROCEDURE:  The patient was administered a general anesthetic  and placed in the dorsal lithotomy position.  Genitalia and perineum were  prepped and draped. The 22 French cystoscope was passed through  his urethra.  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 This was  easily traversed with the beak of the scope.  A left retrograde was  performed. This revealed a filling defect at the left UPJ and a filling  defect in the lower pole calix laterally.  I then placed a guidewire  cystoscopically. I then passed a rigid ureteroscope directly up the ureter  to this UPJ stone which was fragmented in several pieces.  The pieces then  rinses into the pelvis and I could no longer see them.  I then placed the 55  cm ureteral access sheath over top of the guidewire and then passed the  flexible ureteroscope into the left renal pelvis. I first saw these stones  in the posterior pelvis. I then guided the scope into the lower pole calix  where the other stone was seen and fragmented it in multiple small  fragments.  The stones within the left renal pelvis were then extracted. I  had to extract these behind the access sheath, removing the access sheath as  I removed the stone as these were too large to pass through.  I then  replaced the guidewire cystoscopically and placed the ureteroscope up into  the left renal pelvis.  I saw some small fragments which I thought would  easily pass. I then inspected all calices and no other stones were seen.   A right retrograde was performed after the cystoscope was placed. This  showed an obvious filling defect at the right UPJ.  This was not present  prior evaluation recently in the office. I then passed a guidewire and then  removed the cystoscope and placed the 6 French long ureteroscope up to the  UPJ stone.  It was then fragmented into at least 2-3 pieces.  I then placed  an access sheath back over the guidewire after the ureteroscope had been  removed. The flexible ureteroscope was then passed through the access sheath  and the entire caliceal system inspected.  Based on the prior CT scans,  there were no stones up in the upper pole  calices. Despite this, I did  inspect each calix.  No stones were seen.  There was a large stone in the  lower pole calix.  This was fragmented partially and then extracted. I had  to remove the access sheath while I was removing the stone. I then replaced  the access sheath cystoscopically and again replaced the ureteroscope.  I  grasped the stone that was free in the pelvis. This was a fairly large stone  and when I was extracting it became stuck in the mid ureter.  I then opened  up the nitinol basket and left the basket in place and removed the scope.  The scope was replaced so that the laser fiber could be placed through it. I  then fragmented the stone in the mid ureter such that I was more easily able  to extract the calculus.  I then replaced the access sheath over a guidewire  and again inspected the kidney.  I saw one other stone within the kidney.  This was fragmented with a laser.   At this point, visualization was not very well due to a small amount of  blood within the renal pelvis.  I felt like I had treated three stones in  this right kidney. A fourth one was present on a CT scan but I could not  find it.  It may have been submucosal.  At this point, I felt that the stone  burden had been greatly reduced. I then placed double J stents bilaterally  over guidewire. A 6 French x 26 cm stent was placed in each kidney using  fluoroscopic and cystoscopic guidance. Good curls were seen proximally and  distally. The strings were left on the end and brought through the penis and  taped to the external penis.  The bladder was drained and the procedure  terminated.   The patient tolerated the procedure well.  He was taken to the PACU in  stable condition.                                               Bertram Millard. Retta Diones, M.D.    SMD/MEDQ  D:  03/01/2004  T:  03/01/2004  Job:  [id number]

## 2011-03-11 NOTE — H&P (Signed)
Vincent Richard, BACCAM                 ACCOUNT NO.:  [id number]   MEDICAL RECORD NO.:  [id number]          PATIENT TYPE:  INP   LOCATION:  5731                         FACILITY:  [facility]   PHYSICIAN:  Leonie Man, M.D.   DATE OF BIRTH:  [birth year]   DATE OF ADMISSION:  11/22/2006  DATE OF DISCHARGE:                              HISTORY & PHYSICAL   PHYSICIANS:  Primary care physician Dr. Alwyn Ren, gastroenterology Dr.  Matthias Hughs.   CHIEF COMPLAINT:  Recurrent abdominal pain, history of diverticulitis.   HISTORY OF PRESENT ILLNESS:  Vincent Richard is a 46-year male patient well  known to surgical services. The patient was initially diagnosed with  acute diverticulitis in October 2007. First presentation he had at that  time a pelvic abscess and underwent percutaneous drainage per  interventional radiology. Cultures at that time were positive for  Klebsiella that was resistant to ampicillin so antibiotic therapy was  eventually changed to Cipro and Flagyl. The drain was left in place for  10 days and discontinued. The patient went home and did well. Was able  to tolerate solid food and oral pain pills without any discomfort. In  middle December the patient was readmitted with similar symptoms. CT at  that time revealed an abscess measuring 3.2 x 3.3 cm in the same area.  This was much smaller than the original abscess. The patient once again  underwent percutaneous drainage and was empirically treated with Zosyn,  Cipro, and Diflucan. Abscess culture subsequently grew out  nothing. He  was eventually discharged home. At this point the patient was eventually  progressed back to solid diet and was having no abdominal pains. Plans  were to proceed with elective colectomy in March 2008 with Dr. Lurene Shadow.  This past Saturday the patient had return of similar pain. Called into  our office and was initially made n.p.o. for 24 hours. Started on clear  liquids subsequently thereafter and Augmentin and oral  pain pills. He  was able to tolerate clear liquids without any abdominal pain, but by  Tuesday his diet was advanced and he began noticing increasing pain. He  had previously been having normal bowel movements. Now he has been  unable to have a bowel movement, and he has had no diarrhea. Because of  increased pain he spoke at length with Dr. Lurene Shadow this morning, and it  was determined the patient would be appropriate for direct admission.   REVIEW OF SYSTEMS:  As above. No fevers, no chills, no myalgias. No  blood, no diarrhea. No emesis. Positive flatus.   PAST MEDICAL HISTORY:  1. Dyslipidemia.  2. Diverticulosis.  3. History of prior renal calculi.   PAST SURGICAL HISTORY:  1. Right inguinal hernia repair.  2. Lithotripsy.  3. Tonsils and adenoids out as a child.   SOCIAL HISTORY:  No alcohol or tobacco. Married. Wife is a C.R.N.A. at  [employer].   ALLERGIES:  NKDA.   CURRENT MEDICATIONS:  1. Vytorin 10/40.  2. Folic acid.  3. Flaxseed.  4. Aspirin 81 mg.  5. Oral garlic.  6.  Augmentin b.i.d.  7. Oxycodone for pain.   PHYSICAL EXAMINATION:  GENERAL:  Pleasant male patient complaining of  bilateral pelvic discomfort greater on the right. VITAL SIGNS:  Temperature 99.5, BP 122/77, pulse 120, respirations 20, weight 238  pounds, height 72 inches.  NEUROLOGICAL:  Patient is alert, oriented x3, moving all extremities x4  without focal deficits.  HEENT:  Head normocephalic, sclerae  noninjected.  NECK:  Supple. No adenopathy.  CHEST:  Bilateral lung sounds clear to auscultation. Respiratory effort  nonlabored. He is satting 95% on room air.  CARDIAC:  S1, S2 without rubs, murmurs or gallops. Pulses regular. He is  tachycardic.  ABDOMEN:  Soft, slightly distended. Diminished bowel  sounds. He is tender in the pelvic region with mild guarding on the  right lower quadrant.  EXTREMITIES:  Symmetrical in appearance with trace lower extremity edema   greater on the left. Pulses are palpable.   LABORATORIES AND DIAGNOSTICS:  Pending this admission.   IMPRESSION:  1. Recurrent diverticulitis. Rule out recurrent abscess.  2. Dyslipidemia.  3. Renal calculi.   PLAN:  1. NPO until after CT done. Possibly clears later today.  2. Empiric Zosyn IV.  3. IV pain meds. Use PCA, Toradol, and PPI.  4. IV fluid hydration.   ADDITIONAL RECOMMENDATIONS:  Per Dr. Lurene Shadow.      Allison L. Rennis Harding, N.P.      Leonie Man, M.D.  Electronically Signed    ALE/MEDQ  D:  11/22/2006  T:  11/22/2006  Job:  [id number]   cc:   Bernette Redbird, M.D.  Titus Dubin. Alwyn Ren, MD,FACP,FCCP

## 2011-03-11 NOTE — Consult Note (Signed)
Vincent Richard, Vincent Richard                 ACCOUNT NO.:  [id number]   MEDICAL RECORD NO.:  [id number]          PATIENT TYPE:  INP   LOCATION:  5706                         FACILITY:  [facility]   PHYSICIAN:  Bernette Redbird, M.D.   DATE OF BIRTH:  04/10/1961   DATE OF CONSULTATION:  11/23/2006  DATE OF DISCHARGE:                                 CONSULTATION   Dr. Burnice Logan, the infectious disease consultant for Vincent Richard, asked  [place] to see this very pleasant 50 year old gentleman because of an  abnormal radiographic appearance of the small bowel in association with  recurrent intra-abdominal abscesses.   Vincent Richard has known diverticular disease which is one possible  explanation for his recurrent abscesses, but there is also question  being raised of Crohn's disease.   He first developed an intra-abdominal abscess last September, and it was  drained percutaneously.  He then had a second episode in December,  ironically right around the time he was going to have colonoscopic  evaluation by Dr. Ewing Richard.  He was again drained and treated with  antibiotics, and then redeveloped similar symptoms a few days ago,  prompting this admission, at which time updated CT scanning shows  recurrent abscess to be present intra-abdominally.  That same scan  showed what appeared to be thickening of the terminal ileum with ileal  inflammatory changes, thus raising for the first time a question of  Crohn's disease.  Note that there is no family history of Crohn's  disease, nor has the patient ever had obstructive symptomatology, nor  does he have extraintestinal manifestations of Crohn's disease such as  mouth sores, unusual arthralgias, erythema nodosum or ocular problems.   PAST MEDICAL HISTORY:  No known allergies.   CURRENT MEDICATIONS:  As an outpatient were aspirin, Vytorin and various  supplements.   OPERATIONS:  Right inguinal hernia repair and lithotripsy.   MEDICAL ILLNESSES:  Include the  above-mentioned recurrent abscesses,  history of diverticulosis, history of dyslipidemia and also history of  kidney stones.   HABITS:  Nonsmoker, nondrinker.   FAMILY HISTORY:  Positive for diverticular disease.  Negative for  inflammatory bowel disease, colon cancer or colon polyps.   SOCIAL HISTORY:  Married, works as a Acupuncturist at [employer].   REVIEW OF SYSTEMS:  Negative for loss of weight or obstructive  symptomatology, see HPI.  Also negative for upper tract symptoms such as  indigestion or dysphagia.   PHYSICAL EXAM:  Vincent Richard looks very healthy.  He is without pallor or  icterus.  Chest and heart are unremarkable.  The abdomen at this time is  essentially nontender without organomegaly or masses.  He is afebrile.   LABS:  White count 11,200, hemoglobin 15.7, albumin 3.9, platelets  normal at 182,000.   CT scan see above.   IMPRESSION:  I am fairly sure that the patient's ileal thickening is an  epiphenomenon of his intra-abdominal abscesses, rather than a primary  source of them.  In particular, I tend to doubt that this patient has  Crohn's disease because of several features,  including:  Absence of  anemia, absence of intercurrent symptoms, absence of chronic disease  symptomatology, absence of extraintestinal manifestations of Crohn's  disease, absence of obstructive symptomatology.  Ordinarily, in order to  get an intra-abdominal abscess from Crohn's, we would expect a fistula  to be present and fistulas usually do not occur in the absence of the  stricture.  No stricture has been visualized so far on CT scanning, nor  does the patient have the obstructive symptomatology to suggest one.  His lab work looks so good and he looks so healthy overall that it is  hard to believe that he would be harboring a chronic inflammatory  disease of sufficient magnitude to lead to these kinds of complications.  Moreover, he has known diverticular disease  as a very viable alternative  explanation for the origin of the abscesses, plus a family history of  diverticulitis to boot.   RECOMMENDATIONS:  I will review the CT scans with the radiologist and  probably our next step will be a small bowel series to try to better  define the anatomy and mucosal texture of the terminal ileum, and also  to hopefully exclude fistulizing processes.  If that exam is  inconclusive, but does not show any high-grade strictures, the neck step  would probably be a small bowel capsule endoscopy.   I very much appreciate the opportunity to have seen this patient again  in consultation.           ______________________________  Bernette Redbird, M.D.     RB/MEDQ  D:  11/23/2006  T:  11/23/2006  Job:  [id number]   cc:   Vincent Situ. Flavia Shipper., M.D.  Vincent Man, M.D.  Vincent Dubin. Alwyn Ren, MD,FACP,FCCP  Vincent Richard, M.D.

## 2011-03-11 NOTE — Discharge Summary (Signed)
NAMECASIN, FEDERICI                 ACCOUNT NO.:  [id number]   MEDICAL RECORD NO.:  [id number]          PATIENT TYPE:  INP   LOCATION:  5509                         FACILITY:  [facility]   PHYSICIAN:  Leonie Man, M.D.   DATE OF BIRTH:  15-Mar-1961   DATE OF ADMISSION:  10/07/2006  DATE OF DISCHARGE:  10/12/2006                               DISCHARGE SUMMARY   ADMISSION DIAGNOSIS:  Recurrent complicated diverticulitis.   DISCHARGE DIAGNOSIS:  Recurrent complicated diverticulitis.   PROCEDURES IN HOSPITAL:  Percutaneous drainage of sigmoid diverticular  abscess.   COMPLICATIONS:  None.   CONDITION ON DISCHARGE:  Improved.   HOSPITAL COURSE AND HISTORY:  Mr. 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  The drain was subsequently removed.  It was noted then  that his cultures showed Klebsiella pneumonia.  The patient was  continued on oral ampicillin and ciprofloxacin and then subsequently  switched with ciprofloxacin and Flagyl with continued improvement.  The  patient Malenfant is then being scheduled to come back in for definitive  surgery when on October 07, 2006 he again developed worsening left  lower quadrant pain and was readmitted to the hospital.  Repeat CT scan  did show another diverticular abscess which was somewhat remote from the  original site but certainly appear  to be diverticular in origin.  The  patient was started on Cipro, Flagyl, and Zosyn initially.  He was  subsequently switched to Primaxin while in the hospital.  Repeat  percutaneous drainage got approximately 30 mL of pus.   On his fourth hospital day, he had repeat CT scan which showed that the  abscess had been significantly decompressed with no communication with  the bowel.  He was then discharged home with Augmentin and a low-residue  diet for followup in the  office in one week.      Leonie Man, M.D.  Electronically Signed     PB/MEDQ  D:  12/20/2006  T:  12/20/2006  Job:  [id number]

## 2011-03-11 NOTE — Discharge Summary (Signed)
Vincent Richard, Vincent Richard                 ACCOUNT NO.:  [id number]   MEDICAL RECORD NO.:  [id number]          PATIENT TYPE:  INP   LOCATION:  5732                         FACILITY:  [facility]   PHYSICIAN:  Titus Dubin. Hopper, MD,FACP,FCCPDATE OF BIRTH:  [birth year]   DATE OF ADMISSION:  08/04/2006  DATE OF DISCHARGE:  08/05/2006                                 DISCHARGE SUMMARY   REASON FOR ADMISSION:  [name] was admitted for observation following the  placement of a percutaneous drain for diverticular abscess.   HISTORY OF PRESENT ILLNESS:  He was seen on August 03, 2006 with abdominal  pain which had been present for at least a month associated with  intermittent temperature spikes for approximately 3 weeks.  He stated that  he would have temperature elevations approximately 4 times a week.  He has  had a weight loss of almost 40 pounds over the past year but this is in the  context of restricting carbohydrates and sugars.   He was treated with lithotripsy on August 30th by Dr. Retta Diones for a right  sided renal calculi.  On July 02, 2006 he experienced pain in the  suprapubic area.  It was felt that this most likely represented calculi  debris from a known left renal calculus.  He was treated for Levaquin for 7  days.   He did notice that he did pass fragments following the lower abdominal pain  which was more central rather than lateral.   Since that time, he has had some gaseous type pain with the fever.   In the office, his temperature was 100.7.  On exam, there was suggestion of  a mass inferior and to the left of the mid abdominal line.  He was sent for  a CT to rule out diverticular abscess and placed on Amoxil 500 mg 3 times a  day, and metronidazole 3 times a day.  He was given a prescription for  Levsin SL which was not filled as he denied pain @ the time of the office  visit.   The CT scan did document a 7.6 x 6.9 cm gas and fluid mass compatible with  an abscess.   There was suggestion of sigmoid diverticulosis and possible  colitis with the abscess.  These findings were in close proximity to the  bladder explaining the pain he experienced in early September.   He was sent to [facility], to Interventional Radiology where a drain was inserted  and 50 cc of purulent bloody material aspirated.  He was admitted for  observation at the request of the Radiologist &  Dr. Michaelle Birks, General  Surgery was consulted.  She reviewed the CT's pre and post catheter  placement, and felt there was no indication for surgical intervention.   LABORATORY DATA:  His initial white count was 12,900 on August 03, 2006.  Amylase and lipase were normal.  BUN was 10 and creatinine 1.3.  Liver  function tests were also normal.   PHYSICAL EXAMINATION:  The morning of discharge, he was experiencing no  pain.  His temperature max over  the previous 12 hours had been 99.5.  Pulse  was 81 and regular, respiratory rate was 20.  Blood pressure was 121/67.  O2  sat's were 95% on room air.  Abdomen was non tender with normal bowel  sounds.  There was minimal residual serous material in the catheter bulb.  White count had dropped from a high of 12,900 to 8100 the morning of  discharge.   DISPOSITION:  He will be discharged on the metronidazole and amoxicillin.  He will also be placed on ciprofloxacin 500 mg twice a day to optimize  antibiotic therapy.  He will have a repeat CT scan as an outpatient in 1  week.  At that time, a cystogram will be performed to rule out the formation  of a fistula or other complications related to the diverticular abscess.   His discharge status is improved.  His prognosis is good clinically, in view  of the absence of temperature elevations and abdominal findings.  As noted,  the abdomen was soft and non tender.  Additionally, prognosis is good as he  has addressed the major metabolic syndrome risks with diet and exercise.  This has undoubtedly made a  significant difference in his immune status and  his ability to respond to this infection.   Following resolution of the diverticular abscess, GI consultation will be  pursued to consider colonoscopy.  This should be delayed at least 6 weeks to  minimize risk of perforation.   Additionally, following discharge, his metabolic syndrome risk will be  reassessed with fasting lipids & HgbA1c.   DISCHARGE INSTRUCTIONS:  Diet will be as tolerated.  He will be asked to  avoid popcorn, peanuts, or any foods that have small seeds which could  theoretically aggravate the diverticular disease which is clinically thought  to be present.He is to return to the ER if pain,fever or increasing purulent  secretions from the drain occur.      Titus Dubin. Alwyn Ren, MD,FACP,FCCP  Electronically Signed     [facility]/MEDQ  D:  08/05/2006  T:  08/05/2006  Job:  [id number]   cc:   Bertram Millard. Dahlstedt, M.D.

## 2011-03-11 NOTE — Discharge Summary (Signed)
Vincent Richard, Vincent Richard                 ACCOUNT NO.:  [id number]   MEDICAL RECORD NO.:  [id number]          PATIENT TYPE:  INP   LOCATION:  5706                         FACILITY:  [facility]   PHYSICIAN:  Leonie Man, M.D.   DATE OF BIRTH:  1961/10/21   DATE OF ADMISSION:  11/22/2006  DATE OF DISCHARGE:  11/27/2006                               DISCHARGE SUMMARY   ADMISSION DIAGNOSIS:  Recurrent diverticulitis.   DISCHARGE DIAGNOSIS:  Recurrent diverticulitis.   PROCEDURES IN HOSPITAL:  No surgical procedures.   DIAGNOSTIC STUDIES:  1. CT scan of the abdomen showing intraabdominal abscesses with small      bowel swelling in the region of previously perforated  Diverticulitis.  1. Small bowel series, essentially normal findings with specifically      no evidence of Crohn's disease.   CBC, on admission, showed a white blood cell count of 11.2, hemoglobin  15.7, hematocrit 46.0.  At the time of discharge, WBC 6.6, hemoglobin  12.6, hematocrit 36.2.   IN HOSPITAL COMPLICATIONS:  None.   CONDITION AT DISCHARGE:  Improved.   TREATMENTS WHILE IN HOSPITAL:  IV antibiotics piperacillin and  tazobactam, later switched to Primaxin.  Throughout the course of the  hospital, the patient had 2 low-grade temperatures at approximately 99.  Subsequently, he has been afebrile.  Abdominal pain and cramping on  admission has essentially subsided.  Repeat CT scan shows significant  improvement in the area of inflammation and abscess.   DISCHARGE PLANS:  The patient will continue on Augmentin and  ciprofloxacin for a minimum of 4 weeks and then he will be reevaluated.   HOSPITAL COURSE:  [name] is a 50 year old male who was admitted  with his third recurrence of acute diverticulitis.  He has undergone  percutaneous drainage of intraabdominal diverticular abscesses twice in  the past.  On this admission, he had been, as an outpatient, waiting for  scheduling for surgery for a sigmoid  colectomy when he began having  severe abdominal pain again.  He was brought back into the hospital  where a reevaluation showed 2 small abscesses within the abdomen.  He  was placed on antibiotics as noted above with significant improvement.   Consultations from both infectious disease and GI showed:  1. No evidence of Crohn's disease as the etiologic agent for his      recurrent abscesses.  2. Antibiotics were suggested that he switch to Primaxin and to be      continued as an outpatient on Augmentin and Cipro.   At the time of discharge, the patient is feeling again really well.  He  is not having any abdominal pain.  He is going to the bathroom normally  without any diarrhea or constipation and he is afebrile.  Our plans are  to follow up with him in approximately 3 weeks for reassessment and to  consider scheduling him for surgery.      Leonie Man, M.D.  Electronically Signed     PB/MEDQ  D:  11/27/2006  T:  11/28/2006  Job:  [id number]

## 2011-07-28 LAB — POCT HEMOGLOBIN-HEMACUE: Hemoglobin: 16.1 g/dL (ref 13.0–17.0)

## 2013-10-24 DIAGNOSIS — C801 Malignant (primary) neoplasm, unspecified: Secondary | ICD-10-CM

## 2013-10-24 HISTORY — DX: Malignant (primary) neoplasm, unspecified: C80.1

## 2016-01-04 ENCOUNTER — Other Ambulatory Visit: Payer: Self-pay | Admitting: Family Medicine

## 2016-01-04 DIAGNOSIS — C029 Malignant neoplasm of tongue, unspecified: Secondary | ICD-10-CM

## 2016-01-13 ENCOUNTER — Ambulatory Visit
Admission: RE | Admit: 2016-01-13 | Discharge: 2016-01-13 | Disposition: A | Discharge status: Home / Self Care | Payer: No Typology Code available for payment source | Source: Ambulatory Visit | Attending: Family Medicine | Admitting: Family Medicine

## 2016-01-13 DIAGNOSIS — C029 Malignant neoplasm of tongue, unspecified: Secondary | ICD-10-CM

## 2016-01-13 MED ORDER — GADOBENATE DIMEGLUMINE 529 MG/ML IV SOLN
17.0000 mL | Freq: Once | INTRAVENOUS | Status: AC | PRN
  Administered 2016-01-13: 17 mL via INTRAVENOUS

## 2018-07-10 ENCOUNTER — Ambulatory Visit: Payer: 59 | Admitting: Podiatry

## 2018-07-12 ENCOUNTER — Ambulatory Visit: Payer: 59 | Admitting: Podiatry

## 2018-07-17 ENCOUNTER — Ambulatory Visit: Payer: 59 | Admitting: Podiatry

## 2018-07-17 ENCOUNTER — Ambulatory Visit (INDEPENDENT_AMBULATORY_CARE_PROVIDER_SITE_OTHER): Payer: 59

## 2018-07-17 ENCOUNTER — Encounter: Payer: Self-pay | Admitting: Podiatry

## 2018-07-17 VITALS — BP 141/73 | HR 68 | Resp 16

## 2018-07-17 DIAGNOSIS — M779 Enthesopathy, unspecified: Secondary | ICD-10-CM

## 2018-07-17 DIAGNOSIS — Q828 Other specified congenital malformations of skin: Secondary | ICD-10-CM

## 2018-07-17 DIAGNOSIS — M79671 Pain in right foot: Secondary | ICD-10-CM | POA: Diagnosis not present

## 2018-07-17 DIAGNOSIS — D492 Neoplasm of unspecified behavior of bone, soft tissue, and skin: Secondary | ICD-10-CM | POA: Diagnosis not present

## 2018-07-17 DIAGNOSIS — M778 Other enthesopathies, not elsewhere classified: Secondary | ICD-10-CM

## 2018-07-18 NOTE — Progress Notes (Signed)
  Subjective:  Patient ID: Vincent Richard, male    DOB: 03-30-1961,  MRN: [id number] HPI Chief Complaint  Patient presents with  . Foot Pain    Sub 5th MPJ right - aching x 3-4 months, feels small knot, noticed after wearing different shoes  . Plantar Fasciitis    Plantar heels bilateral - dx fasciitis years ago, intermittent symptoms  . NOTE    Lives in [place] - here til Jan 2020  . New Patient (Initial Visit)    57 y.o. male presents with the above complaint.   ROS: Denies fever chills nausea vomiting muscle aches pains calf pain back pain chest pain shortness of breath.  No past medical history on file.   Current Outpatient Medications:  .  atorvastatin (LIPITOR) 10 MG tablet, , Disp: , Rfl:  .  levothyroxine (SYNTHROID, LEVOTHROID) 75 MCG tablet, , Disp: , Rfl:   No Known Allergies Review of Systems Objective:   Vitals:   07/17/18 0921  BP: (!) 141/73  Pulse: 68  Resp: 16    General: Well developed, nourished, in no acute distress, alert and oriented x3   Dermatological: Skin is warm, dry and supple bilateral. Nails x 10 are well maintained; remaining integument appears unremarkable at this time. There are no open sores, no preulcerative lesions, no rash or signs of infection present.  Painful porokeratotic lesion plantar aspect of the fifth metatarsal head right foot.  No open lesions or wounds are noted.  No ulcerations.  Vascular: Dorsalis Pedis artery and Posterior Tibial artery pedal pulses are 2/4 bilateral with immedate capillary fill time. Pedal hair growth present. No varicosities and no lower extremity edema present bilateral.   Neruologic: Grossly intact via light touch bilateral. Vibratory intact via tuning fork bilateral. Protective threshold with Semmes Wienstein monofilament intact to all pedal sites bilateral. Patellar and Achilles deep tendon reflexes 2+ bilateral. No Babinski or clonus noted bilateral.   Musculoskeletal: No gross boney pedal deformities  bilateral. No pain, crepitus, or limitation noted with foot and ankle range of motion bilateral. Muscular strength 5/5 in all groups tested bilateral.  Very mild tenderness on palpation and medial calcaneal tubercles bilaterally.  Gait: Unassisted, Nonantalgic.    Radiographs:  Demonstrate no acute findings in this osseously mature individual.  Assessment & Plan:   Assessment: Plantarflexed fifth metatarsal porokeratotic lesion  Plan: Mechanical and chemical destruction porokeratotic lesion on her aspect right foot.  He will follow-up with me in 6 weeks prior to leaving for [place].     Max T. Wheeler, [place]

## 2018-07-20 ENCOUNTER — Other Ambulatory Visit: Payer: Self-pay | Admitting: Urology

## 2018-08-27 ENCOUNTER — Other Ambulatory Visit: Payer: Self-pay

## 2018-08-27 ENCOUNTER — Encounter (HOSPITAL_BASED_OUTPATIENT_CLINIC_OR_DEPARTMENT_OTHER): Payer: Self-pay | Admitting: *Deleted

## 2018-08-27 NOTE — Progress Notes (Signed)
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

## 2018-09-02 NOTE — Anesthesia Preprocedure Evaluation (Addendum)
Anesthesia Evaluation  Patient identified by MRN, date of birth, ID band Patient awake    Reviewed: Allergy & Precautions, NPO status , Patient's Chart, lab work & pertinent test results  History of Anesthesia Complications Negative for: history of anesthetic complications  Airway Mallampati: II  TM Distance: >3 FB Neck ROM: Full    Dental  (+) Dental Advisory Given, Teeth Intact   Pulmonary neg pulmonary ROS,    breath sounds clear to auscultation       Cardiovascular negative cardio ROS   Rhythm:Regular Rate:Normal     Neuro/Psych negative neurological ROS  negative psych ROS   GI/Hepatic negative GI ROS, Neg liver ROS,   Endo/Other  Hypothyroidism   Renal/GU  Kidney stones      Musculoskeletal negative musculoskeletal ROS (+)   Abdominal   Peds  Hematology negative hematology ROS (+)   Anesthesia Other Findings 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  Reproductive/Obstetrics                            Anesthesia Physical Anesthesia Plan  ASA: II  Anesthesia Plan: General   Post-op Pain Management:    Induction: Intravenous  PONV Risk Score and Plan: 2 and Treatment may vary due to age or medical condition, Ondansetron, Dexamethasone and Midazolam  Airway Management Planned: LMA  Additional Equipment: None  Intra-op Plan:   Post-operative Plan: Extubation in OR  Informed Consent: I have reviewed the patients History and Physical, chart, labs and discussed the procedure including the risks, benefits and alternatives for the proposed anesthesia with the patient or authorized representative who has indicated his/her understanding and acceptance.   Dental advisory given  Plan Discussed with: CRNA and Anesthesiologist  Anesthesia Plan Comments:        Anesthesia Quick Evaluation

## 2018-09-02 NOTE — H&P (Signed)
H&P  Chief Complaint: Left sided kidney stones  History of Present Illness: 57 year old male--Miionary in [place] with h/o urolithiasis presents for URS for mgmt of left renal calculi  Past Medical History:  Diagnosis Date  . 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 History of kidney stones   . Hypothyroidism     Past Surgical History:  Procedure Laterality Date  . COLON SURGERY  2017 october   surgery for diverticulitis  . CYSTOSCOPY     multiple in past done  . EXTRACORPOREAL SHOCK WAVE LITHOTRIPSY     multiple done in past  . HERNIA REPAIR     inguinal and incisional hernia repair    Home Medications:  Allergies as of 09/02/2018   No Known Allergies     Medication List    Notice   Cannot display discharge medications because the patient has not yet been admitted.     Allergies: No Known Allergies  History reviewed. No pertinent family history.  Social History:  reports that he has never smoked. He has never used smokeless tobacco. He reports that he drank alcohol. He reports that he does not use drugs.  ROS: A complete review of systems was performed.  All systems are negative except for pertinent findings as noted.  Physical Exam:  Vital signs in last 24 hours:   Constitutional:  Alert and oriented, No acute distress Cardiovascular: Regular rate  Respiratory: Normal respiratory effort GI: Abdomen is soft, nontender, nondistended, no abdominal masses. No CVAT.  Genitourinary: Normal male phallus, testes are descended bilaterally and non-tender and without masses, scrotum is normal in appearance without lesions or masses, perineum is normal on inspection. Lymphatic: No lymphadenopathy Neurologic: Grossly intact, no focal deficits Psychiatric: Normal mood and affect  Laboratory Data:  No results for input(s): WBC, HGB, HCT, PLT in the last 72 hours.  No results for input(s): NA, K, CL, GLUCOSE, BUN, CALCIUM,  CREATININE in the last 72 hours.  Invalid input(s): CO3   No results found for this or any previous visit (from the past 24 hour(s)). No results found for this or any previous visit (from the past 240 hour(s)).  Renal Function: No results for input(s): CREATININE in the last 168 hours. CrCl cannot be calculated (Patient's most recent lab result is older than the maximum 21 days allowed.).  Radiologic Imaging: No results found.  Impression/Assessment:  Left renal calculi  Plan:  Cysto, left RGP, left URS, HLL and extraction of left renal stones

## 2018-09-03 ENCOUNTER — Encounter (HOSPITAL_BASED_OUTPATIENT_CLINIC_OR_DEPARTMENT_OTHER)
Admission: RE | Disposition: A | Discharge status: Home / Self Care | Payer: Self-pay | Source: Ambulatory Visit | Attending: Urology

## 2018-09-03 ENCOUNTER — Encounter (HOSPITAL_BASED_OUTPATIENT_CLINIC_OR_DEPARTMENT_OTHER): Payer: Self-pay

## 2018-09-03 ENCOUNTER — Ambulatory Visit (HOSPITAL_BASED_OUTPATIENT_CLINIC_OR_DEPARTMENT_OTHER): Payer: 59 | Admitting: Certified Registered Nurse Anesthetist

## 2018-09-03 ENCOUNTER — Ambulatory Visit (HOSPITAL_BASED_OUTPATIENT_CLINIC_OR_DEPARTMENT_OTHER)
Admission: RE | Admit: 2018-09-03 | Discharge: 2018-09-03 | Disposition: A | Discharge status: Home / Self Care | Payer: 59 | Source: Ambulatory Visit | Attending: Urology | Admitting: Urology

## 2018-09-03 DIAGNOSIS — Z85810 Personal history of malignant neoplasm of tongue: Secondary | ICD-10-CM | POA: Diagnosis not present

## 2018-09-03 DIAGNOSIS — N200 Calculus of kidney: Secondary | ICD-10-CM | POA: Insufficient documentation

## 2018-09-03 DIAGNOSIS — Z9221 Personal history of antineoplastic chemotherapy: Secondary | ICD-10-CM | POA: Insufficient documentation

## 2018-09-03 DIAGNOSIS — Z923 Personal history of irradiation: Secondary | ICD-10-CM | POA: Insufficient documentation

## 2018-09-03 HISTORY — DX: Hypothyroidism, unspecified: E03.9

## 2018-09-03 HISTORY — PX: CYSTOSCOPY WITH RETROGRADE PYELOGRAM, URETEROSCOPY AND STENT PLACEMENT: SHX5789

## 2018-09-03 HISTORY — DX: Personal history of urinary calculi: Z87.442

## 2018-09-03 HISTORY — PX: HOLMIUM LASER APPLICATION: SHX5852

## 2018-09-03 HISTORY — DX: Malignant (primary) neoplasm, unspecified: C80.1

## 2018-09-03 LAB — LIPID PANEL
CHOL/HDL RATIO: 6.9 ratio
Cholesterol: 172 mg/dL (ref 0–200)
HDL: 25 mg/dL — AB (ref >40)
LDL CALC: 80 mg/dL (ref 0–99)
TRIGLYCERIDES: 335 mg/dL — AB (ref <150)
VLDL: 67 mg/dL — ABNORMAL HIGH (ref 0–40)

## 2018-09-03 SURGERY — CYSTOURETEROSCOPY, WITH RETROGRADE PYELOGRAM AND STENT INSERTION
Anesthesia: General | Site: Ureter | Laterality: Left

## 2018-09-03 MED ORDER — KETOROLAC TROMETHAMINE 30 MG/ML IJ SOLN
INTRAMUSCULAR | Status: AC
  Filled 2018-09-03: qty 1

## 2018-09-03 MED ORDER — LACTATED RINGERS IV SOLN
INTRAVENOUS | Status: DC
  Administered 2018-09-03 (×2): via INTRAVENOUS
  Filled 2018-09-03: qty 1000

## 2018-09-03 MED ORDER — PROMETHAZINE HCL 25 MG/ML IJ SOLN
6.2500 mg | INTRAMUSCULAR | Status: DC | PRN
  Filled 2018-09-03: qty 1

## 2018-09-03 MED ORDER — TRAMADOL HCL 50 MG PO TABS: 50.0000 mg | ORAL_TABLET | Freq: Four times a day (QID) | ORAL | 0 refills | Status: AC | PRN

## 2018-09-03 MED ORDER — PHENYLEPHRINE 40 MCG/ML (10ML) SYRINGE FOR IV PUSH (FOR BLOOD PRESSURE SUPPORT)
PREFILLED_SYRINGE | INTRAVENOUS | Status: DC | PRN
  Administered 2018-09-03 (×4): 80 ug via INTRAVENOUS

## 2018-09-03 MED ORDER — ACETAMINOPHEN 10 MG/ML IV SOLN
INTRAVENOUS | Status: DC | PRN
  Administered 2018-09-03: 1000 mg via INTRAVENOUS

## 2018-09-03 MED ORDER — PROPOFOL 10 MG/ML IV BOLUS
INTRAVENOUS | Status: AC
  Filled 2018-09-03: qty 40

## 2018-09-03 MED ORDER — KETOROLAC TROMETHAMINE 30 MG/ML IJ SOLN
INTRAMUSCULAR | Status: DC | PRN
  Administered 2018-09-03: 30 mg via INTRAVENOUS

## 2018-09-03 MED ORDER — ACETAMINOPHEN 10 MG/ML IV SOLN
INTRAVENOUS | Status: AC
  Filled 2018-09-03: qty 100

## 2018-09-03 MED ORDER — MIDAZOLAM HCL 2 MG/2ML IJ SOLN
INTRAMUSCULAR | Status: AC
  Filled 2018-09-03: qty 2

## 2018-09-03 MED ORDER — OXYBUTYNIN CHLORIDE 5 MG PO TABS: 5.0000 mg | ORAL_TABLET | Freq: Three times a day (TID) | ORAL | 0 refills | Status: DC | PRN

## 2018-09-03 MED ORDER — FENTANYL CITRATE (PF) 100 MCG/2ML IJ SOLN
25.0000 ug | INTRAMUSCULAR | Status: DC | PRN
  Filled 2018-09-03: qty 1

## 2018-09-03 MED ORDER — ONDANSETRON HCL 4 MG/2ML IJ SOLN
INTRAMUSCULAR | Status: DC | PRN
  Administered 2018-09-03: 4 mg via INTRAVENOUS

## 2018-09-03 MED ORDER — DEXAMETHASONE SODIUM PHOSPHATE 10 MG/ML IJ SOLN
INTRAMUSCULAR | Status: AC
  Filled 2018-09-03: qty 1

## 2018-09-03 MED ORDER — OXYCODONE HCL 5 MG/5ML PO SOLN
5.0000 mg | Freq: Once | ORAL | Status: DC | PRN
  Filled 2018-09-03: qty 5

## 2018-09-03 MED ORDER — LIDOCAINE 2% (20 MG/ML) 5 ML SYRINGE
INTRAMUSCULAR | Status: AC
  Filled 2018-09-03: qty 5

## 2018-09-03 MED ORDER — CEFAZOLIN SODIUM-DEXTROSE 2-4 GM/100ML-% IV SOLN
2.0000 g | INTRAVENOUS | Status: AC
  Administered 2018-09-03: 2 g via INTRAVENOUS
  Filled 2018-09-03: qty 100

## 2018-09-03 MED ORDER — CEFAZOLIN SODIUM-DEXTROSE 2-4 GM/100ML-% IV SOLN
INTRAVENOUS | Status: AC
  Filled 2018-09-03: qty 100

## 2018-09-03 MED ORDER — CEPHALEXIN 500 MG PO CAPS: 500.0000 mg | ORAL_CAPSULE | Freq: Two times a day (BID) | ORAL | 0 refills | Status: DC

## 2018-09-03 MED ORDER — OXYCODONE HCL 5 MG PO TABS
5.0000 mg | ORAL_TABLET | Freq: Once | ORAL | Status: DC | PRN
  Filled 2018-09-03: qty 1

## 2018-09-03 MED ORDER — FENTANYL CITRATE (PF) 100 MCG/2ML IJ SOLN
INTRAMUSCULAR | Status: AC
  Filled 2018-09-03: qty 2

## 2018-09-03 MED ORDER — LIDOCAINE 2% (20 MG/ML) 5 ML SYRINGE
INTRAMUSCULAR | Status: DC | PRN
  Administered 2018-09-03: 100 mg via INTRAVENOUS

## 2018-09-03 MED ORDER — ONDANSETRON HCL 4 MG/2ML IJ SOLN
INTRAMUSCULAR | Status: AC
  Filled 2018-09-03: qty 2

## 2018-09-03 MED ORDER — SODIUM CHLORIDE 0.9 % IR SOLN
Status: DC | PRN
  Administered 2018-09-03: 3000 mL

## 2018-09-03 MED ORDER — PROPOFOL 10 MG/ML IV BOLUS
INTRAVENOUS | Status: DC | PRN
  Administered 2018-09-03: 250 mg via INTRAVENOUS

## 2018-09-03 MED ORDER — MIDAZOLAM HCL 5 MG/5ML IJ SOLN
INTRAMUSCULAR | Status: DC | PRN
  Administered 2018-09-03: 2 mg via INTRAVENOUS

## 2018-09-03 MED ORDER — PHENYLEPHRINE 40 MCG/ML (10ML) SYRINGE FOR IV PUSH (FOR BLOOD PRESSURE SUPPORT)
PREFILLED_SYRINGE | INTRAVENOUS | Status: AC
  Filled 2018-09-03: qty 10

## 2018-09-03 MED ORDER — DEXAMETHASONE SODIUM PHOSPHATE 10 MG/ML IJ SOLN
INTRAMUSCULAR | Status: DC | PRN
  Administered 2018-09-03: 10 mg via INTRAVENOUS

## 2018-09-03 MED ORDER — IOHEXOL 300 MG/ML  SOLN
INTRAMUSCULAR | Status: DC | PRN
  Administered 2018-09-03: 5 mL

## 2018-09-03 MED ORDER — FENTANYL CITRATE (PF) 100 MCG/2ML IJ SOLN
INTRAMUSCULAR | Status: DC | PRN
  Administered 2018-09-03 (×2): 50 ug via INTRAVENOUS

## 2018-09-03 SURGICAL SUPPLY — 34 items
BAG DRAIN URO-CYSTO SKYTR STRL (DRAIN) ×3 IMPLANT
BAG DRN UROCATH (DRAIN) ×1
BASKET ZERO TIP NITINOL 2.4FR (BASKET) IMPLANT
BSKT STON RTRVL ZERO TP 2.4FR (BASKET)
CATH INTERMIT  6FR 70CM (CATHETERS) ×3 IMPLANT
CATH URET 5FR 28IN CONE TIP (BALLOONS)
CATH URET 5FR 70CM CONE TIP (BALLOONS) IMPLANT
CLOTH BEACON ORANGE TIMEOUT ST (SAFETY) ×3 IMPLANT
ELECT REM PT RETURN 9FT ADLT (ELECTROSURGICAL)
ELECTRODE REM PT RTRN 9FT ADLT (ELECTROSURGICAL) IMPLANT
EXTRACTOR STONE NITINOL NGAGE (UROLOGICAL SUPPLIES) ×2 IMPLANT
FIBER LASER TRAC TIP (UROLOGICAL SUPPLIES) ×2 IMPLANT
GLOVE BIO SURGEON STRL SZ7 (GLOVE) ×2 IMPLANT
GLOVE BIO SURGEON STRL SZ8 (GLOVE) ×3 IMPLANT
GLOVE BIOGEL PI IND STRL 7.0 (GLOVE) IMPLANT
GLOVE BIOGEL PI IND STRL 7.5 (GLOVE) IMPLANT
GLOVE BIOGEL PI INDICATOR 7.0 (GLOVE) ×2
GLOVE BIOGEL PI INDICATOR 7.5 (GLOVE) ×2
GOWN STRL REUS W/ TWL XL LVL3 (GOWN DISPOSABLE) ×1 IMPLANT
GOWN STRL REUS W/TWL LRG LVL3 (GOWN DISPOSABLE) ×2 IMPLANT
GOWN STRL REUS W/TWL XL LVL3 (GOWN DISPOSABLE) ×3
GUIDEWIRE ANG ZIPWIRE 038X150 (WIRE) IMPLANT
GUIDEWIRE STR DUAL SENSOR (WIRE) ×2 IMPLANT
IV NS IRRIG 3000ML ARTHROMATIC (IV SOLUTION) ×4 IMPLANT
KIT TURNOVER CYSTO (KITS) ×3 IMPLANT
MANIFOLD NEPTUNE II (INSTRUMENTS) ×2 IMPLANT
NS IRRIG 500ML POUR BTL (IV SOLUTION) ×1 IMPLANT
PACK CYSTO (CUSTOM PROCEDURE TRAY) ×3 IMPLANT
SHEATH ACCESS URETERAL 38CM (SHEATH) IMPLANT
SHEATH ACCESS URETERAL 54CM (SHEATH) ×2 IMPLANT
STENT URET 6FRX26 CONTOUR (STENTS) ×2 IMPLANT
TUBE CONNECTING 12'X1/4 (SUCTIONS) ×1
TUBE CONNECTING 12X1/4 (SUCTIONS) ×1 IMPLANT
TUBING UROLOGY SET (TUBING) ×2 IMPLANT

## 2018-09-03 NOTE — Discharge Instructions (Signed)
1. You may see some blood in the urine and may have some burning with urination for 48-72 hours. You also may notice that you have to urinate more frequently or urgently after your procedure which is normal.  2. You should call should you develop an inability urinate, fever > 101, persistent nausea and vomiting that prevents you from eating or drinking to stay hydrated.  3. If you have a stent, you will likely urinate more frequently and urgently until the stent is removed and you may experience some discomfort/pain in the lower abdomen and flank especially when urinating. You may take pain medication prescribed to you if needed for pain. You may also intermittently have blood in the urine until the stent is removed.  It is okay to remove the stent on Thursday morning.  [facility] [phone] Post Ureteroscopy With or Without Stent Instructions  Definitions:  Ureter: The duct that transports urine from the kidney to the bladder. Stent:   A plastic hollow tube that is placed into the ureter, from the kidney to the                 bladder to prevent the ureter from swelling shut.  GENERAL INSTRUCTIONS:  Despite the fact that no skin incisions were used, the area around the ureter and bladder is raw and irritated. The stent is a foreign body which will further irritate the bladder wall. This irritation is manifested by increased frequency of urination, both day and night, and by an increase in the urge to urinate. In some, the urge to urinate is present almost always. Sometimes the urge is strong enough that you may not be able to stop yourself from urinating. The only real cure is to remove the stent and then give time for the bladder wall to heal which can't be done until the danger of the ureter swelling shut has passed, which varies.  You may see some blood in your urine while the stent is in place and a few days afterwards. Do not be alarmed, even if the urine was clear for a  while. Get off your feet and drink lots of fluids until clearing occurs. If you start to pass clots or don't improve, call us.  DIET: You may return to your normal diet immediately. Because of the raw surface of your bladder, alcohol, spicy foods, acid type foods and drinks with caffeine may cause irritation or frequency and should be used in moderation. To keep your urine flowing freely and to avoid constipation, drink plenty of fluids during the day ( 8-10 glasses ). Tip: Avoid cranberry juice because it is very acidic.  ACTIVITY: Your physical activity doesn't need to be restricted. However, if you are very active, you may see some blood in your urine. We suggest that you reduce your activity under these circumstances until the bleeding has stopped.  BOWELS: It is important to keep your bowels regular during the postoperative period. Straining with bowel movements can cause bleeding. A bowel movement every other day is reasonable. Use a mild laxative if needed, such as Milk of Magnesia 2-3 tablespoons, or 2 Dulcolax tablets. Call if you continue to have problems. If you have been taking narcotics for pain, before, during or after your surgery, you may be constipated. Take a laxative if necessary.   MEDICATION: You should resume your pre-surgery medications unless told not to. In addition you will often be given an antibiotic to prevent infection. These should be taken as prescribed  until the bottles are finished unless you are having an unusual reaction to one of the drugs.  PROBLEMS YOU SHOULD REPORT TO [place]:  Fevers over 100.5 Fahrenheit.  Heavy bleeding, or clots ( See above notes about blood in urine ).  Inability to urinate.  Drug reactions ( hives, rash, nausea, vomiting, diarrhea ).  Severe burning or pain with urination that is not improving.  FOLLOW-UP: You will need a follow-up appointment to monitor your progress. Call for this appointment at the number listed above. Usually  the first appointment will be about three to fourteen days after your surgery.    Post Anesthesia Home Care Instructions  Activity: Get plenty of rest for the remainder of the day. A responsible adult should stay with you for 24 hours following the procedure.  For the next 24 hours, DO NOT: -Drive a car -Paediatric nurse -Drink alcoholic beverages -Take any medication unless instructed by your physician -Make any legal decisions or sign important papers.  Meals: Start with liquid foods such as gelatin or soup. Progress to regular foods as tolerated. Avoid greasy, spicy, heavy foods. If nausea and/or vomiting occur, drink only clear liquids until the nausea and/or vomiting subsides. Call your physician if vomiting continues.  Special Instructions/Symptoms: Your throat may feel dry or sore from the anesthesia or the breathing tube placed in your throat during surgery. If this causes discomfort, gargle with warm salt water. The discomfort should disappear within 24 hours.  If you had a scopolamine patch placed behind your ear for the management of post- operative nausea and/or vomiting:  1. The medication in the patch is effective for 72 hours, after which it should be removed.  Wrap patch in a tissue and discard in the trash. Wash hands thoroughly with soap and water. 2. You may remove the patch earlier than 72 hours if you experience unpleasant side effects which may include dry mouth, dizziness or visual disturbances. 3. Avoid touching the patch. Wash your hands with soap and water after contact with the patch.   May take Ibuprofen, Advil, Motrin, or Aleve after 3 PM.

## 2018-09-03 NOTE — Op Note (Signed)
Preoperative diagnosis: Left renal calculi  Postoperative diagnosis: Same  Principal procedure: Cystoscopy, left retrograde ureteropyelogram, fluoroscopic interpretation, left ureteroscopy, holmium laser lithotripsy and extraction of left renal calculi, placement of 6 French by 26 cm contour double-J stent with tether  Surgeon: Glendel Jaggers  Anesthesia: General with LMA  Complications: None  Specimen: Stone fragments  Estimated blood loss: None  Drains: None  Indications: 57 year old male with recurrent urolithiasis.  He does spend the majority of his time in [place] doing [place] work.  The patient does have intermittent passage of stones.  Recent follow-up in October with KUB revealed stone burden on the left side, the largest of which was approximately 9 mm in size.  He presents at this time for cystoscopy, left retrograde, ureteroscopy, laser and extraction of left renal calculi.  The patient is aware of the risks and complications of the procedure, having had several of these previous.  He desires to proceed.  Description of procedure: The patient was properly identified and marked in the holding area.  He received preoperative IV antibiotics.  He was taken to the operating room where general anesthetic was administered with the LMA.  He is placed in the dorsolithotomy position.  Genitalia and perineum were prepped and draped.  Proper timeout was performed.  A 21 French panendoscope was advanced into his bladder through his urethra.  Urethra was normal, prostate nonobstructive.  Bladder inspected circumferentially.  No tumors, no trabeculations or foreign bodies.  Ureteral orifices were normal in configuration location.  6 [place] open-ended catheter was utilized for retrograde ureteral pyelogram.  This revealed a normal ureter throughout the course.  Pyelocalyceal system was normal.  Filling defect in 1 of the interpolar calyces consistent with the large stone seen on KUB.  Following  retrograde, sensor tip guidewire was advanced through the open-ended catheter.  The open-ended catheter and cystoscope were removed.  The ureter was then dilated first with the inner core and then the entire 09/1456 cm ureteral access catheter.  The sheath was left indwelling just at the ureteropelvic junction.  The obturator was removed.  Using the flexible dual-lumen ureteroscope, renoscopy was performed.  This revealed 2 small stones in a lower pole calyx.  These were extracted with the engage basket.  Larger stone burden was present in an interpolar calyx.  This was fragmented utilizing the holmium laser through a 200 m fiber.  Multiple smaller fragments resulted.  These were extracted using the engage basket as well.  Tiny particles could not be engaged, and were left indwelling.  Thorough inspection of the remaining calyceal system revealed no further stone material.  The pelvis was without stone.  At this point, the ureteroscope was removed.  The guidewire was replaced and the ureteral access catheter was removed.  Once backloaded through the cystoscope, the guidewire was utilized to deploy a 26 cm, 6 French double-J stent.  The tether was left intact.  Once adequately positioned using fluoroscopic and cystoscopic guidance, the scope was removed.  The tether was brought through the urethra, tied, and then trimmed.  It was taped to the penis following drainage of the bladder.  This point, the procedure was terminated.  The patient was awakened and taken to the PACU in stable condition.  He tolerated the procedure well.

## 2018-09-03 NOTE — Anesthesia Postprocedure Evaluation (Signed)
Anesthesia Post Note  Patient: Vincent Richard  Procedure(s) Performed: CYSTOSCOPY WITH RETROGRADE PYELOGRAM, URETEROSCOPY AND STENT PLACEMENT WITH STONE EXTRACTION (Left Ureter) HOLMIUM LASER APPLICATION (Left Ureter)     Patient location during evaluation: PACU Anesthesia Type: General Level of consciousness: awake and alert Pain management: pain level controlled Vital Signs Assessment: post-procedure vital signs reviewed and stable Respiratory status: spontaneous breathing, nonlabored ventilation and respiratory function stable Cardiovascular status: blood pressure returned to baseline and stable Postop Assessment: no apparent nausea or vomiting Anesthetic complications: no    Last Vitals:  Vitals:   09/03/18 0915 09/03/18 0951  BP: 121/72 137/83  Pulse: 70 71  Resp: 16 16  Temp:  36.7 C  SpO2: 92% 97%    Last Pain:  Vitals:   09/03/18 0951  TempSrc:   PainSc: 0-No pain                 Audry Pili

## 2018-09-03 NOTE — Interval H&P Note (Signed)
History and Physical Interval Note:  09/03/2018 7:27 AM  Vincent Richard  has presented today for surgery, with the diagnosis of LEFT RENAL STONES  The various methods of treatment have been discussed with the patient and family. After consideration of risks, benefits and other options for treatment, the patient has consented to  Procedure(s): CYSTOSCOPY WITH RETROGRADE PYELOGRAM, URETEROSCOPY AND STENT PLACEMENT WITH STONE EXTRACTION (Left) HOLMIUM LASER APPLICATION (Left) as a surgical intervention .  The patient's history has been reviewed, patient examined, no change in status, stable for surgery.  I have reviewed the patient's chart and labs.  Questions were answered to the patient's satisfaction.     Lillette Boxer [place]

## 2018-09-03 NOTE — Anesthesia Procedure Notes (Signed)
Procedure Name: LMA Insertion Date/Time: 09/03/2018 7:37 AM Performed by: Bonney Aid, CRNA Pre-anesthesia Checklist: Patient identified, Emergency Drugs available, Suction available and Patient being monitored Patient Re-evaluated:Patient Re-evaluated prior to induction Oxygen Delivery Method: Circle system utilized Preoxygenation: Pre-oxygenation with 100% oxygen Induction Type: IV induction Ventilation: Mask ventilation without difficulty LMA: LMA inserted LMA Size: 5.0 Number of attempts: 1 Airway Equipment and Method: Bite block Placement Confirmation: positive ETCO2 Tube secured with: Tape Dental Injury: Teeth and Oropharynx as per pre-operative assessment

## 2018-09-03 NOTE — Transfer of Care (Signed)
Immediate Anesthesia Transfer of Care Note  Patient: Vincent Richard  Procedure(s) Performed: CYSTOSCOPY WITH RETROGRADE PYELOGRAM, URETEROSCOPY AND STENT PLACEMENT WITH STONE EXTRACTION (Left Ureter) HOLMIUM LASER APPLICATION (Left Ureter)  Patient Location: PACU  Anesthesia Type:General  Level of Consciousness: awake, alert  and oriented  Airway & Oxygen Therapy: Patient Spontanous Breathing and Patient connected to nasal cannula oxygen  Post-op Assessment: Report given to RN  Post vital signs: Reviewed and stable  Last Vitals:  Vitals Value Taken Time  BP 124/82 09/03/2018  8:37 AM  Temp 36.8 C 09/03/2018  8:37 AM  Pulse 79 09/03/2018  8:39 AM  Resp 12 09/03/2018  8:39 AM  SpO2 97 % 09/03/2018  8:39 AM  Vitals shown include unvalidated device data.  Last Pain:  Vitals:   09/03/18 0616  TempSrc:   PainSc: 0-No pain      Patients Stated Pain Goal: 7 (82/06/01 5615)  Complications: No apparent anesthesia complications

## 2018-09-04 ENCOUNTER — Encounter (HOSPITAL_BASED_OUTPATIENT_CLINIC_OR_DEPARTMENT_OTHER): Payer: Self-pay | Admitting: Urology

## 2018-09-04 ENCOUNTER — Ambulatory Visit: Payer: 59 | Admitting: Podiatry

## 2020-05-26 DIAGNOSIS — H9193 Unspecified hearing loss, bilateral: Secondary | ICD-10-CM | POA: Insufficient documentation

## 2020-05-26 DIAGNOSIS — C01 Malignant neoplasm of base of tongue: Secondary | ICD-10-CM | POA: Insufficient documentation

## 2020-05-26 DIAGNOSIS — H9313 Tinnitus, bilateral: Secondary | ICD-10-CM | POA: Insufficient documentation

## 2020-05-26 DIAGNOSIS — H6123 Impacted cerumen, bilateral: Secondary | ICD-10-CM | POA: Insufficient documentation

## 2022-09-12 NOTE — Progress Notes (Signed)
H&P  Chief Complaint: History of elevated PSA, history of urolithiasis  History of Present Illness: 61 year old male whom I have seen him multiple times before in our [facility] practice, presents today for first visit here with [facility] urology.  He has a history of elevated PSA.  He underwent ultrasound and biopsy in November 2020 for PSA of 2.59.  Prostate volume was 26.5 mL.  PSAs since that time were 0.73 and 1.2 in April 2022 and 2023, respectively.  He has a history of urolithiasis.  He has had several interventions for bilateral stones.  He has had no stone symptoms recently.  He does have ED.  100 mg of sildenafil is no longer taking care of his erections.  He would like to consider other therapy.  He does have easy fatigability.  He would like to have a testosterone level checked.  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   History of kidney stones    Hypothyroidism     Past Surgical History:  Procedure Laterality Date   COLON SURGERY  2017 october   surgery for diverticulitis   CYSTOSCOPY     multiple in past done   CYSTOSCOPY WITH RETROGRADE PYELOGRAM, URETEROSCOPY AND STENT PLACEMENT Left 09/03/2018   Procedure: CYSTOSCOPY WITH RETROGRADE PYELOGRAM, URETEROSCOPY AND STENT PLACEMENT WITH STONE EXTRACTION;  Surgeon: Franchot Gallo, MD;  Location: [facility];  Service: Urology;  Laterality: Left;   EXTRACORPOREAL SHOCK WAVE LITHOTRIPSY     multiple done in past   HERNIA REPAIR     inguinal and incisional hernia repair   HOLMIUM LASER APPLICATION Left 70/96/2836   Procedure: HOLMIUM LASER APPLICATION;  Surgeon: Franchot Gallo, MD;  Location: [facility];  Service: Urology;  Laterality: Left;    Home Medications:  Allergies as of 09/13/2022   No Known Allergies      Medication List        Accurate as of September 12, 2022  8:04 PM. If you  have any questions, ask your nurse or doctor.          aspirin 81 MG chewable tablet Chew by mouth daily.   atorvastatin 10 MG tablet Commonly known as: LIPITOR at bedtime.   cephALEXin 500 MG capsule Commonly known as: Keflex Take 1 capsule (500 mg total) by mouth 2 (two) times daily.   levothyroxine 75 MCG tablet Commonly known as: SYNTHROID 75 mcg at bedtime. Takes '75mg'$  Monday, wed, Friday, 122mg all other days   multivitamin with minerals tablet Take 1 tablet by mouth daily.   oxybutynin 5 MG tablet Commonly known as: DITROPAN Take 1 tablet (5 mg total) by mouth every 8 (eight) hours as needed for up to 15 doses for bladder spasms.   UNABLE TO FIND Vitamin b 6 26 mg bid        Allergies: No Known Allergies  No family history on file.  Social History:  reports that he has never smoked. He has never used smokeless tobacco. He reports that he does not currently use alcohol. He reports that he does not use drugs.  ROS: A complete review of systems was performed.  All systems are negative except for pertinent findings as noted.  Physical Exam:  Vital signs in last 24 hours: There were no vitals taken for this visit. Constitutional:  Alert and oriented, No acute distress Cardiovascular: Regular rate  Respiratory: Normal respiratory effort Neurologic: Grossly  intact, no focal deficits Psychiatric: Normal mood and affect  I have reviewed prior pt notes  I have reviewed notes from referring/previous physicians-AUS notes reviewed  I have reviewed urinalysis results  I have independently reviewed prior imaging-prior KUB reviewed  I have reviewed prior PSA results   Impression/Assessment:  1.  History of bilateral renal calculi.  Currently asymptomatic.  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  He would like to take care of any significant stones before then.  2.  ED, organic, worsening  3.  Low energy level  4.   History of elevated PSA with negative biopsy and more recent normal trend  Plan:  1.  I did prescribe him tamsulosin and oxycodone for him to take to [place] if he has further stones  2.  I will order morning testosterone panel call with results  3.  I will set up an CT stone protocol to evaluate for stones  4.  Follow-up depending on the above results

## 2022-09-13 ENCOUNTER — Encounter: Payer: Self-pay | Admitting: Urology

## 2022-09-13 ENCOUNTER — Ambulatory Visit (INDEPENDENT_AMBULATORY_CARE_PROVIDER_SITE_OTHER): Payer: No Typology Code available for payment source | Admitting: Urology

## 2022-09-13 VITALS — BP 160/88 | HR 76 | Ht 73.0 in | Wt 220.0 lb

## 2022-09-13 DIAGNOSIS — R5383 Other fatigue: Secondary | ICD-10-CM | POA: Diagnosis not present

## 2022-09-13 DIAGNOSIS — N200 Calculus of kidney: Secondary | ICD-10-CM | POA: Diagnosis not present

## 2022-09-13 DIAGNOSIS — N5201 Erectile dysfunction due to arterial insufficiency: Secondary | ICD-10-CM

## 2022-09-13 MED ORDER — OXYCODONE HCL 5 MG PO TABS: 5.0000 mg | ORAL_TABLET | Freq: Four times a day (QID) | ORAL | 0 refills | Status: AC | PRN

## 2022-09-13 MED ORDER — TAMSULOSIN HCL 0.4 MG PO CAPS: 0.4000 mg | ORAL_CAPSULE | Freq: Every day | ORAL | 11 refills | Status: AC

## 2022-09-14 LAB — URINALYSIS, ROUTINE W REFLEX MICROSCOPIC
Bilirubin, UA: NEGATIVE
Glucose, UA: NEGATIVE
Ketones, UA: NEGATIVE
Leukocytes,UA: NEGATIVE
Nitrite, UA: NEGATIVE
Protein,UA: NEGATIVE
Specific Gravity, UA: 1.015 (ref 1.005–1.030)
Urobilinogen, Ur: 0.2 mg/dL (ref 0.2–1.0)
pH, UA: 5 (ref 5.0–7.5)

## 2022-09-14 LAB — MICROSCOPIC EXAMINATION
Bacteria, UA: NONE SEEN
Epithelial Cells (non renal): NONE SEEN /hpf (ref 0–10)

## 2022-10-10 ENCOUNTER — Telehealth: Payer: Self-pay

## 2022-10-10 NOTE — Telephone Encounter (Signed)
Patient states his CT is has been scheduled and someone was supposed to inform him of his oop cost after his insurance pays.  Can you please follow up with pt with any information you may have.  Thank you!

## 2022-10-13 ENCOUNTER — Ambulatory Visit (HOSPITAL_COMMUNITY)
Admission: RE | Admit: 2022-10-13 | Discharge: 2022-10-13 | Disposition: A | Discharge status: Home / Self Care | Payer: PRIVATE HEALTH INSURANCE | Source: Ambulatory Visit | Attending: Urology | Admitting: Urology

## 2022-10-13 DIAGNOSIS — N200 Calculus of kidney: Secondary | ICD-10-CM | POA: Diagnosis present

## 2022-10-31 DIAGNOSIS — Z8042 Family history of malignant neoplasm of prostate: Secondary | ICD-10-CM | POA: Insufficient documentation

## 2022-10-31 DIAGNOSIS — I10 Essential (primary) hypertension: Secondary | ICD-10-CM | POA: Insufficient documentation

## 2023-10-04 DIAGNOSIS — E039 Hypothyroidism, unspecified: Secondary | ICD-10-CM | POA: Insufficient documentation

## 2024-02-13 ENCOUNTER — Ambulatory Visit: Admitting: Podiatry

## 2024-02-13 ENCOUNTER — Encounter: Payer: Self-pay | Admitting: Podiatry

## 2024-02-13 DIAGNOSIS — M7751 Other enthesopathy of right foot: Secondary | ICD-10-CM | POA: Diagnosis not present

## 2024-02-13 DIAGNOSIS — D2371 Other benign neoplasm of skin of right lower limb, including hip: Secondary | ICD-10-CM | POA: Diagnosis not present

## 2024-02-13 MED ORDER — DEXAMETHASONE SODIUM PHOSPHATE 120 MG/30ML IJ SOLN
2.0000 mg | Freq: Once | INTRAMUSCULAR | Status: AC
  Administered 2024-02-13: 2 mg via INTRA_ARTICULAR

## 2024-02-13 NOTE — Progress Notes (Signed)
 Subjective:  Patient ID: Vincent Richard, male    DOB: 01-10-1961,  MRN: [id number] HPI Chief Complaint  Patient presents with   Foot Pain    Sub 5th MPJ right - small, callused area x years, treated for same issue 2019, has come back, wants to know if can get rid of it for good   New Patient (Initial Visit)    Est pt 2019    63 y.o. male presents with the above complaint.   ROS: Denies fever chills nausea mobic muscle aches pains calf pain back pain chest pain shortness of breath.  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  She is a Scientist, clinical (histocompatibility and immunogenetics).  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   History of kidney stones    Hypothyroidism    Past Surgical History:  Procedure Laterality Date   COLON SURGERY  2017 october   surgery for diverticulitis   CYSTOSCOPY     multiple in past done   CYSTOSCOPY WITH RETROGRADE PYELOGRAM, URETEROSCOPY AND STENT PLACEMENT Left 09/03/2018   Procedure: CYSTOSCOPY WITH RETROGRADE PYELOGRAM, URETEROSCOPY AND STENT PLACEMENT WITH STONE EXTRACTION;  Surgeon: Trent Frizzle, MD;  Location: [facility];  Service: Urology;  Laterality: Left;   EXTRACORPOREAL SHOCK WAVE LITHOTRIPSY     multiple done in past   HERNIA REPAIR     inguinal and incisional hernia repair   HOLMIUM LASER APPLICATION Left 09/03/2018   Procedure: HOLMIUM LASER APPLICATION;  Surgeon: Trent Frizzle, MD;  Location: [facility];  Service: Urology;  Laterality: Left;    Current Outpatient Medications:    atorvastatin (LIPITOR) 10 MG tablet, at bedtime. , Disp: , Rfl:    levothyroxine (SYNTHROID, LEVOTHROID) 75 MCG tablet, 75 mcg at bedtime. Takes 75mg  Monday, wed, Friday, 100mcg all other days, Disp: , Rfl:    Multiple Vitamins-Minerals (MULTIVITAMIN WITH MINERALS) tablet, Take 1 tablet by mouth daily., Disp: , Rfl:    oxyCODONE  (ROXICODONE ) 5  MG immediate release tablet, Take 1 tablet (5 mg total) by mouth every 6 (six) hours as needed for severe pain., Disp: 20 tablet, Rfl: 0   tamsulosin  (FLOMAX ) 0.4 MG CAPS capsule, Take 1 capsule (0.4 mg total) by mouth daily after supper., Disp: 90 capsule, Rfl: 11   UNABLE TO FIND, Vitamin b 6 26 mg bid, Disp: , Rfl:   No Known Allergies Review of Systems Objective:  There were no vitals filed for this visit.  General: Well developed, nourished, in no acute distress, alert and oriented x3   Dermatological: Skin is warm, dry and supple bilateral. Nails x 10 are well maintained; remaining integument appears unremarkable at this time. There are no open sores, no preulcerative lesions, no rash or signs of infection present.  Painful porokeratotic lesion plantar aspect of fifth metatarsal head of the right foot.  Also has a palpable bursa beneath the fifth metatarsal head right foot.  Vascular: Dorsalis Pedis artery and Posterior Tibial artery pedal pulses are 2/4 bilateral with immedate capillary fill time. Pedal hair growth present. No varicosities and no lower extremity edema present bilateral.   Neruologic: Grossly intact via light touch bilateral. Vibratory intact via tuning fork bilateral. Protective threshold with Semmes Wienstein monofilament intact to all pedal sites bilateral. Patellar and Achilles deep tendon reflexes 2+ bilateral. No Babinski or clonus noted bilateral.   Musculoskeletal: No gross boney pedal deformities  bilateral. No pain, crepitus, or limitation noted with foot and ankle range of motion bilateral. Muscular strength 5/5 in all groups tested bilateral.  Gait: Unassisted, Nonantalgic.    Radiographs:  None taken   Assessment & Plan:   Assessment: Benign skin lesion and bursitis subfifth met right.  Plan: Discussed etiology pathology and surgical therapies injected the bursa today with 4 mg of dexamethasone  and local anesthetic.  Also went ahead and debrided the  benign hyperkeratotic lesion which was punctated this is performed after sterile Betadine skin prep and no iatrogenic lesions noted.     Janine Reller T. [place], [place]
